# Patient Record
Sex: Male | Born: 1970
Health system: Southern US, Community
[De-identification: ages and names within clinical notes are randomized; demographics above are authoritative.]

## PROBLEM LIST (undated history)

## (undated) DIAGNOSIS — R253 Fasciculation: Secondary | ICD-10-CM

## (undated) DIAGNOSIS — F32A Depression, unspecified: Secondary | ICD-10-CM

## (undated) DIAGNOSIS — I10 Essential (primary) hypertension: Secondary | ICD-10-CM

## (undated) DIAGNOSIS — F419 Anxiety disorder, unspecified: Secondary | ICD-10-CM

## (undated) DIAGNOSIS — E785 Hyperlipidemia, unspecified: Secondary | ICD-10-CM

## (undated) DIAGNOSIS — R52 Pain, unspecified: Secondary | ICD-10-CM

## (undated) DIAGNOSIS — E039 Hypothyroidism, unspecified: Secondary | ICD-10-CM

## (undated) DIAGNOSIS — F329 Major depressive disorder, single episode, unspecified: Secondary | ICD-10-CM

## (undated) DIAGNOSIS — G4730 Sleep apnea, unspecified: Secondary | ICD-10-CM

## (undated) HISTORY — PX: GASTRIC BYPASS: SHX52

## (undated) HISTORY — PX: HERNIA REPAIR: SHX51

## (undated) HISTORY — DX: Fasciculation: R25.3

## (undated) HISTORY — PX: BACK SURGERY: SHX140

## (undated) HISTORY — DX: Hyperlipidemia, unspecified: E78.5

---

## 2008-04-11 ENCOUNTER — Observation Stay (HOSPITAL_COMMUNITY): Admission: EM | Admit: 2008-04-11 | Discharge: 2008-04-12 | Payer: Self-pay | Admitting: Emergency Medicine

## 2008-04-11 ENCOUNTER — Ambulatory Visit: Payer: Self-pay | Admitting: Cardiovascular Disease

## 2009-08-08 ENCOUNTER — Ambulatory Visit (HOSPITAL_COMMUNITY): Admission: RE | Admit: 2009-08-08 | Discharge: 2009-08-08 | Payer: Self-pay | Admitting: Surgery

## 2009-08-13 ENCOUNTER — Ambulatory Visit (HOSPITAL_COMMUNITY): Admission: RE | Admit: 2009-08-13 | Discharge: 2009-08-13 | Payer: Self-pay | Admitting: Surgery

## 2009-09-18 ENCOUNTER — Encounter: Admission: RE | Admit: 2009-09-18 | Discharge: 2009-12-17 | Payer: Self-pay | Admitting: Surgery

## 2009-11-05 ENCOUNTER — Inpatient Hospital Stay (HOSPITAL_COMMUNITY): Admission: RE | Admit: 2009-11-05 | Discharge: 2009-11-07 | Payer: Self-pay | Admitting: Surgery

## 2009-11-06 ENCOUNTER — Ambulatory Visit: Payer: Self-pay | Admitting: Surgery

## 2009-11-06 ENCOUNTER — Encounter (INDEPENDENT_AMBULATORY_CARE_PROVIDER_SITE_OTHER): Payer: Self-pay | Admitting: Surgery

## 2009-11-12 ENCOUNTER — Emergency Department (HOSPITAL_COMMUNITY): Admission: EM | Admit: 2009-11-12 | Discharge: 2009-11-12 | Payer: Self-pay | Admitting: Emergency Medicine

## 2010-01-08 ENCOUNTER — Encounter: Admission: RE | Admit: 2010-01-08 | Discharge: 2010-03-15 | Payer: Self-pay | Admitting: Surgery

## 2010-06-26 ENCOUNTER — Encounter: Admit: 2010-06-26 | Payer: Self-pay | Admitting: Surgery

## 2010-08-07 ENCOUNTER — Other Ambulatory Visit (HOSPITAL_COMMUNITY): Payer: Self-pay | Admitting: Internal Medicine

## 2010-08-12 ENCOUNTER — Ambulatory Visit (HOSPITAL_COMMUNITY)
Admission: RE | Admit: 2010-08-12 | Discharge: 2010-08-12 | Disposition: A | Discharge status: Home / Self Care | Payer: BC Managed Care – PPO | Source: Ambulatory Visit | Attending: Internal Medicine | Admitting: Internal Medicine

## 2010-08-12 DIAGNOSIS — R109 Unspecified abdominal pain: Secondary | ICD-10-CM | POA: Insufficient documentation

## 2010-08-14 ENCOUNTER — Other Ambulatory Visit (HOSPITAL_COMMUNITY): Payer: Self-pay | Admitting: Internal Medicine

## 2010-08-19 ENCOUNTER — Encounter (HOSPITAL_COMMUNITY): Payer: Self-pay

## 2010-08-19 ENCOUNTER — Encounter (HOSPITAL_COMMUNITY)
Admission: RE | Admit: 2010-08-19 | Discharge: 2010-08-19 | Disposition: A | Discharge status: Home / Self Care | Payer: BC Managed Care – PPO | Source: Ambulatory Visit | Attending: Internal Medicine | Admitting: Internal Medicine

## 2010-08-19 DIAGNOSIS — R109 Unspecified abdominal pain: Secondary | ICD-10-CM | POA: Insufficient documentation

## 2010-08-19 HISTORY — DX: Essential (primary) hypertension: I10

## 2010-08-19 MED ORDER — TECHNETIUM TC 99M MEBROFENIN IV KIT
5.0000 | PACK | Freq: Once | INTRAVENOUS | Status: AC | PRN
  Administered 2010-08-19: 5.4 via INTRAVENOUS

## 2010-09-02 LAB — DIFFERENTIAL
Basophils Absolute: 0 10*3/uL (ref 0.0–0.1)
Basophils Absolute: 0 10*3/uL (ref 0.0–0.1)
Basophils Absolute: 0.1 10*3/uL (ref 0.0–0.1)
Basophils Relative: 1 % (ref 0–1)
Basophils Relative: 1 % (ref 0–1)
Eosinophils Absolute: 0 10*3/uL (ref 0.0–0.7)
Eosinophils Absolute: 0.1 10*3/uL (ref 0.0–0.7)
Eosinophils Relative: 1 % (ref 0–5)
Eosinophils Relative: 2 % (ref 0–5)
Lymphocytes Relative: 22 % (ref 12–46)
Lymphocytes Relative: 30 % (ref 12–46)
Lymphs Abs: 0.9 10*3/uL (ref 0.7–4.0)
Lymphs Abs: 1.8 10*3/uL (ref 0.7–4.0)
Lymphs Abs: 2.6 10*3/uL (ref 0.7–4.0)
Monocytes Absolute: 0.5 10*3/uL (ref 0.1–1.0)
Monocytes Absolute: 0.6 10*3/uL (ref 0.1–1.0)
Monocytes Relative: 7 % (ref 3–12)
Monocytes Relative: 7 % (ref 3–12)
Monocytes Relative: 8 % (ref 3–12)
Monocytes Relative: 9 % (ref 3–12)
Neutro Abs: 4.6 10*3/uL (ref 1.7–7.7)
Neutro Abs: 5.5 10*3/uL (ref 1.7–7.7)
Neutro Abs: 7.8 10*3/uL — ABNORMAL HIGH (ref 1.7–7.7)
Neutrophils Relative %: 60 % (ref 43–77)
Neutrophils Relative %: 61 % (ref 43–77)
Neutrophils Relative %: 67 % (ref 43–77)
Neutrophils Relative %: 83 % — ABNORMAL HIGH (ref 43–77)

## 2010-09-02 LAB — COMPREHENSIVE METABOLIC PANEL
ALT: 78 U/L — ABNORMAL HIGH (ref 0–53)
BUN: 11 mg/dL (ref 6–23)
BUN: 13 mg/dL (ref 6–23)
CO2: 24 mEq/L (ref 19–32)
Chloride: 101 mEq/L (ref 96–112)
Chloride: 103 mEq/L (ref 96–112)
Creatinine, Ser: 0.88 mg/dL (ref 0.4–1.5)
GFR calc Af Amer: >60 mL/min (ref >60)
GFR calc non Af Amer: >60 mL/min (ref >60)
Glucose, Bld: 104 mg/dL — ABNORMAL HIGH (ref 70–99)
Potassium: 4 mEq/L (ref 3.5–5.1)
Sodium: 134 mEq/L — ABNORMAL LOW (ref 135–145)

## 2010-09-02 LAB — CBC
HCT: 38.9 % — ABNORMAL LOW (ref 39.0–52.0)
HCT: 42.9 % (ref 39.0–52.0)
Hemoglobin: 14.5 g/dL (ref 13.0–17.0)
MCHC: 33.6 g/dL (ref 30.0–36.0)
MCV: 89.1 fL (ref 78.0–100.0)
MCV: 89.6 fL (ref 78.0–100.0)
Platelets: 181 10*3/uL (ref 150–400)
RBC: 4.35 MIL/uL (ref 4.22–5.81)
RBC: 4.62 MIL/uL (ref 4.22–5.81)
RBC: 4.79 MIL/uL (ref 4.22–5.81)
RBC: 4.84 MIL/uL (ref 4.22–5.81)
RDW: 13.9 % (ref 11.5–15.5)

## 2010-09-02 LAB — HEMOGLOBIN AND HEMATOCRIT, BLOOD: HCT: 40.9 % (ref 39.0–52.0)

## 2010-10-29 NOTE — H&P (Signed)
NAME:  Victor Oconnor, Victor Oconnor NO.:  [id number]   MEDICAL RECORD NO.:  [id number]          PATIENT TYPE:  EMS   LOCATION:  MAJO                         FACILITY:  [facility]   PHYSICIAN:  Brayton El, MD    DATE OF BIRTH:  09-19-1970   DATE OF ADMISSION:  04/11/2008  DATE OF DISCHARGE:                              HISTORY & PHYSICAL   CHIEF COMPLAINT:  Chest tightness.   HISTORY OF PRESENT ILLNESS:  Victor Oconnor is a 40 year old white male past  medical history significant for hypertension, hyperlipidemia, history of  tobacco use, obesity, who is presenting with a 3-day history of  exertional chest tightness.  The patient states that while at work on  Sunday, he noticed that when he exerted himself he felt chest discomfort  in the lower chest, upper abdominal area associated with shortness of  breath.  He states this chest pain was quickly relieved by rest.  Over  the ensuing 2 days, the patient states that the symptoms repeated  themselves, and that whenever he would exert himself to a certain degree  he would feel moderate chest discomfort and his chest discomfort would  resolve upon rest.  Other than the chest discomfort, he has been in his  normal state of health.  He denies any PND, orthopnea, lower extremity  edema or syncope.  He does state that he has felt a strange bubbling  sensation occasionally in the back of his neck.  However, he is unable  to further describe this sensation.  The patient also denies any a past  history of chest discomfort.   PAST MEDICAL HISTORY:  As in HPI.  Also, the patient has hypothyroidism  and is on thyroid replacement for this.   FAMILY HISTORY:  Negative for premature coronary disease.   SOCIAL HISTORY:  The patient has a 20 pack year history of tobacco use,  however he quit smoking 2 years ago.  He does not drink significant  amounts of alcohol.   ALLERGIES:  PENICILLIN.   MEDICATIONS:  1. Levothyroxine 175 mcg daily.  2. He  is on a water pill for hypertension.  3. Lipitor unknown dose.   REVIEW OF SYSTEMS:  As in HPI.  All other systems were reviewed and are  negative.   PHYSICAL EXAMINATION:  VITAL SIGNS:  temperature 97.5, pulse 65,  respirations 20, blood pressure 122/70.  GENERAL:  He is in no acute distress.  HEENT:  Nonfocal.  Normocephalic, atraumatic.  Neck is supple without  JVD or carotid bruit.  There is no lymphadenopathy HEART:  Regular rate  and rhythm without murmur or gallops.  LUNGS:  Clear to auscultation bilaterally.  ABDOMEN:  Soft, nontender, nondistended.  EXTREMITIES:  Without edema.  Skin is warm and dry.  NEUROLOGICAL:  Exam is nonfocal.  PSYCHIATRIC:  Patient is appropriate with normal levels of insight.   LABORATORY DATA:  Sodium 138, potassium 4.5, chloride 102, CO2 29, BUN  16, creatinine 0.70, glucose 97.  White count 8, hemoglobin 16,  hematocrit 48, platelet count 245.  His troponin is less than 0.05.  CK-  MB is 2.1.  His myoglobin 53.3.  LFTs are within normal limits.  His  calcium is 10.2.  In the ED, he had a CT scan the chest to rule out PE  which was indeed negative for pulmonary embolism.  He had abdominal  plain films which were within normal limits.   EKG independently interpreted by myself is normal sinus rhythm without  any ST or T-wave abnormalities.   ASSESSMENT:  70. A 40 year old white male with multiple risk factors for coronary      disease presenting with symptoms most consistent with unstable      angina.  2. Hypothyroidism.  3. Hypertension.  4. Hyperlipidemia.  5. Obesity.   PLAN:  The patient will be admitted to telemetry and ruled out for acute  myocardial infarction.  He will be started on aspirin, IV heparin,  Lopressor and a statin.  We will continue his levothyroxine at his home  dose of 175 mcg daily and check a TSH in the a.m.  We will also place  him on hydrochlorothiazide 12.5 mg daily for hypertension.  He has been  on diuretic at  home for hypertension.  He will be made n.p.o. after  midnight for a stress test versus a left heart catheterization tomorrow.      Brayton El, MD  Electronically Signed     SGA/MEDQ  D:  04/11/2008  T:  04/12/2008  Job:  [id number]

## 2010-10-31 ENCOUNTER — Emergency Department (HOSPITAL_COMMUNITY)
Admission: EM | Admit: 2010-10-31 | Discharge: 2010-10-31 | Disposition: A | Discharge status: Home / Self Care | Payer: BC Managed Care – PPO | Attending: Emergency Medicine | Admitting: Emergency Medicine

## 2010-10-31 ENCOUNTER — Emergency Department (HOSPITAL_COMMUNITY): Payer: BC Managed Care – PPO

## 2010-10-31 DIAGNOSIS — I10 Essential (primary) hypertension: Secondary | ICD-10-CM | POA: Insufficient documentation

## 2010-10-31 DIAGNOSIS — E039 Hypothyroidism, unspecified: Secondary | ICD-10-CM | POA: Insufficient documentation

## 2010-10-31 DIAGNOSIS — M79609 Pain in unspecified limb: Secondary | ICD-10-CM | POA: Insufficient documentation

## 2010-10-31 DIAGNOSIS — Z79899 Other long term (current) drug therapy: Secondary | ICD-10-CM | POA: Insufficient documentation

## 2010-10-31 DIAGNOSIS — M7989 Other specified soft tissue disorders: Secondary | ICD-10-CM | POA: Insufficient documentation

## 2010-10-31 DIAGNOSIS — E7800 Pure hypercholesterolemia, unspecified: Secondary | ICD-10-CM | POA: Insufficient documentation

## 2010-11-01 NOTE — Discharge Summary (Signed)
Victor Oconnor, Victor Oconnor NO.:  [id number]   MEDICAL RECORD NO.:  [id number]          PATIENT TYPE:  OBV   LOCATION:  4733                         FACILITY:  [facility]   PHYSICIAN:  Corky Crafts, MDDATE OF BIRTH:  August 31, 1970   DATE OF ADMISSION:  04/11/2008  DATE OF DISCHARGE:  04/12/2008                               DISCHARGE SUMMARY   DISCHARGE DIAGNOSES:  1. Chest pain, resolved.  2. Hypertension.  3. Dyslipidemia.  4. Hypothyroidism.   HOSPITAL COURSE:  [name] is a 40 year old male patient who was  admitted on April 11, 2008, with substernal chest pain.  He has a  history of hypertension and hyperlipidemia and he was admitted with  exertional chest tightness associated with shortness of breath.  He  remained in the hospital overnight.  His cardiac enzymes were negative.  Total cholesterol 174, triglycerides 125, HDL 46, LDL 103.  Sodium 140,  potassium 3.4, BUN 12, creatinine 0.72.  Hemoglobin 16.3, hematocrit 48.  It was felt safe that the patient could go home.   We elected to perform an exercise Cardiolite in the office on the same  day of discharge, April 12, 2008.  He is to leave the hospital and go  directly to the office for the stress test.  He is to continue taking  his Lipitor daily, his hydrochlorothiazide daily, his aspirin 325 mg a  day, levothyroxine 175 mcg a day, and his gout pill.  Utilize  sublingual nitroglycerin p.r.n. chest pain.      [name], P.A.      Corky Crafts, MD  Electronically Signed    LB/MEDQ  D:  05/30/2008  T:  05/31/2008  Job:  [id number]   cc:   Corky Crafts, MD

## 2010-12-02 ENCOUNTER — Other Ambulatory Visit (INDEPENDENT_AMBULATORY_CARE_PROVIDER_SITE_OTHER): Payer: Self-pay | Admitting: Surgery

## 2010-12-03 LAB — COMPREHENSIVE METABOLIC PANEL
ALT: 14 U/L (ref 0–53)
Albumin: 4.7 g/dL (ref 3.5–5.2)
Alkaline Phosphatase: 57 U/L (ref 39–117)
CO2: 29 mEq/L (ref 19–32)
Chloride: 101 mEq/L (ref 96–112)
Sodium: 136 mEq/L (ref 135–145)
Total Bilirubin: 0.6 mg/dL (ref 0.3–1.2)
Total Protein: 7.8 g/dL (ref 6.0–8.3)

## 2010-12-03 LAB — CBC WITH DIFFERENTIAL/PLATELET
Basophils Absolute: 0 10*3/uL (ref 0.0–0.1)
Basophils Relative: 1 % (ref 0–1)
Eosinophils Absolute: 0.2 10*3/uL (ref 0.0–0.7)
HCT: 44.8 % (ref 39.0–52.0)
MCHC: 33.5 g/dL (ref 30.0–36.0)
MCV: 95.5 fL (ref 78.0–100.0)
Monocytes Relative: 8 % (ref 3–12)
Platelets: 207 10*3/uL (ref 150–400)
RBC: 4.69 MIL/uL (ref 4.22–5.81)
RDW: 13.5 % (ref 11.5–15.5)

## 2010-12-03 LAB — IRON AND TIBC
Iron: 109 ug/dL (ref 42–165)
TIBC: 416 ug/dL (ref 215–435)

## 2010-12-03 LAB — VITAMIN B12: Vitamin B-12: 1949 pg/mL — ABNORMAL HIGH (ref 211–911)

## 2010-12-03 LAB — FOLATE: Folate: >20 ng/mL

## 2010-12-06 ENCOUNTER — Encounter (INDEPENDENT_AMBULATORY_CARE_PROVIDER_SITE_OTHER): Payer: Self-pay | Admitting: Surgery

## 2011-01-22 ENCOUNTER — Ambulatory Visit (INDEPENDENT_AMBULATORY_CARE_PROVIDER_SITE_OTHER): Payer: BC Managed Care – PPO | Admitting: Surgery

## 2011-01-22 ENCOUNTER — Encounter (INDEPENDENT_AMBULATORY_CARE_PROVIDER_SITE_OTHER): Payer: Self-pay | Admitting: Surgery

## 2011-01-22 VITALS — BP 144/88 | HR 72 | Temp 97.5°F | Ht 73.0 in | Wt 276.0 lb

## 2011-01-22 DIAGNOSIS — R209 Unspecified disturbances of skin sensation: Secondary | ICD-10-CM

## 2011-01-22 DIAGNOSIS — R202 Paresthesia of skin: Secondary | ICD-10-CM

## 2011-01-22 NOTE — Progress Notes (Signed)
Mr. Prows comes in today and he is almost 15 months status post Roux-en-Y gastric bypass. He has lost 41% of his excess weight into his BMI is 36. He is having nerve twitches in his legs his arms at times. His been followed Audree Camel, physician assistant in [place] office. She referred him to see me. His other problems are really nonexistent. His main complaint is some occasional numbness and tingling in his fingers and this twitching.  Otherwise he looks good. I will order a calcium level, CMET, and thyroid functions. In the meantime he will start on some quinine water (tonic water ). I'll see him in about 3 weeks in followup and review his lab and see how he is getting along.

## 2011-01-22 NOTE — Patient Instructions (Signed)
Drink one bottle of tonic water (with quinine) nightly See bariatric vitamin recommendation sheets Have lab drawn See me back in the office in 3-4 weeks

## 2011-01-24 ENCOUNTER — Other Ambulatory Visit (INDEPENDENT_AMBULATORY_CARE_PROVIDER_SITE_OTHER): Payer: Self-pay | Admitting: Surgery

## 2011-01-24 LAB — COMPREHENSIVE METABOLIC PANEL
ALT: 18 U/L (ref 0–53)
CO2: 24 mEq/L (ref 19–32)
Calcium: 9.6 mg/dL (ref 8.4–10.5)
Chloride: 104 mEq/L (ref 96–112)
Glucose, Bld: 88 mg/dL (ref 70–99)
Sodium: 138 mEq/L (ref 135–145)
Total Bilirubin: 0.5 mg/dL (ref 0.3–1.2)
Total Protein: 7.6 g/dL (ref 6.0–8.3)

## 2011-01-24 LAB — CALCIUM, IONIZED: Calcium, Ion: 1.19 mmol/L (ref 1.12–1.32)

## 2011-01-24 LAB — T4: T4, Total: 10 ug/dL (ref 5.0–12.5)

## 2011-01-31 ENCOUNTER — Telehealth (INDEPENDENT_AMBULATORY_CARE_PROVIDER_SITE_OTHER): Payer: Self-pay | Admitting: Surgery

## 2011-02-26 ENCOUNTER — Ambulatory Visit (INDEPENDENT_AMBULATORY_CARE_PROVIDER_SITE_OTHER): Payer: BC Managed Care – PPO | Admitting: Surgery

## 2011-03-13 ENCOUNTER — Encounter (INDEPENDENT_AMBULATORY_CARE_PROVIDER_SITE_OTHER): Payer: Self-pay | Admitting: Surgery

## 2011-03-18 LAB — POCT I-STAT, CHEM 8
Calcium, Ion: 1.22
Chloride: 102
Glucose, Bld: 99
HCT: 48
TCO2: 30

## 2011-03-18 LAB — TSH: TSH: 7.382 — ABNORMAL HIGH

## 2011-03-18 LAB — DIFFERENTIAL
Basophils Absolute: 0
Basophils Relative: 0
Eosinophils Absolute: 0.2
Eosinophils Relative: 2
Monocytes Absolute: 0.5
Monocytes Relative: 6
Neutro Abs: 5.3

## 2011-03-18 LAB — LIPASE, BLOOD: Lipase: 28

## 2011-03-18 LAB — BASIC METABOLIC PANEL
CO2: 27
Chloride: 105
GFR calc Af Amer: >60
Potassium: 3.4 — ABNORMAL LOW
Sodium: 140

## 2011-03-18 LAB — LIPID PANEL
Cholesterol: 174
Total CHOL/HDL Ratio: 3.8

## 2011-03-18 LAB — CARDIAC PANEL(CRET KIN+CKTOT+MB+TROPI)
CK, MB: 1.9
Relative Index: 1.7
Total CK: 113
Total CK: 114
Troponin I: <0.01

## 2011-03-18 LAB — URINALYSIS, ROUTINE W REFLEX MICROSCOPIC
Bilirubin Urine: NEGATIVE
Hgb urine dipstick: NEGATIVE
Protein, ur: NEGATIVE
Urobilinogen, UA: 0.2

## 2011-03-18 LAB — CBC
HCT: 47.5
Hemoglobin: 15.8
MCHC: 33.3
MCV: 92.8
RBC: 5.12
RDW: 12.8

## 2011-03-18 LAB — COMPREHENSIVE METABOLIC PANEL
ALT: 30
AST: 37
CO2: 29
Chloride: 102
Creatinine, Ser: 0.69
GFR calc Af Amer: >60
GFR calc non Af Amer: >60
Glucose, Bld: 97
Sodium: 138
Total Bilirubin: 1.4 — ABNORMAL HIGH

## 2011-03-18 LAB — PROTIME-INR
INR: 1
Prothrombin Time: 13.8

## 2011-12-11 ENCOUNTER — Ambulatory Visit: Payer: Self-pay

## 2011-12-11 ENCOUNTER — Other Ambulatory Visit: Payer: Self-pay | Admitting: Occupational Medicine

## 2011-12-11 DIAGNOSIS — M25519 Pain in unspecified shoulder: Secondary | ICD-10-CM

## 2012-01-05 ENCOUNTER — Encounter (HOSPITAL_COMMUNITY): Payer: Self-pay | Admitting: Pharmacy Technician

## 2012-01-07 ENCOUNTER — Ambulatory Visit (HOSPITAL_COMMUNITY)
Admission: RE | Admit: 2012-01-07 | Discharge: 2012-01-07 | Disposition: A | Discharge status: Home / Self Care | Payer: BC Managed Care – PPO | Source: Ambulatory Visit | Attending: Orthopedic Surgery | Admitting: Orthopedic Surgery

## 2012-01-07 ENCOUNTER — Encounter (HOSPITAL_COMMUNITY): Payer: Self-pay

## 2012-01-07 ENCOUNTER — Encounter (HOSPITAL_COMMUNITY)
Admission: RE | Admit: 2012-01-07 | Discharge: 2012-01-07 | Disposition: A | Discharge status: Home / Self Care | Payer: BC Managed Care – PPO | Source: Ambulatory Visit | Attending: Orthopedic Surgery | Admitting: Orthopedic Surgery

## 2012-01-07 DIAGNOSIS — S43429A Sprain of unspecified rotator cuff capsule, initial encounter: Secondary | ICD-10-CM | POA: Insufficient documentation

## 2012-01-07 DIAGNOSIS — X58XXXA Exposure to other specified factors, initial encounter: Secondary | ICD-10-CM | POA: Insufficient documentation

## 2012-01-07 DIAGNOSIS — Z01812 Encounter for preprocedural laboratory examination: Secondary | ICD-10-CM | POA: Insufficient documentation

## 2012-01-07 DIAGNOSIS — Z01810 Encounter for preprocedural cardiovascular examination: Secondary | ICD-10-CM | POA: Insufficient documentation

## 2012-01-07 HISTORY — DX: Sleep apnea, unspecified: G47.30

## 2012-01-07 HISTORY — DX: Anxiety disorder, unspecified: F41.9

## 2012-01-07 HISTORY — DX: Major depressive disorder, single episode, unspecified: F32.9

## 2012-01-07 HISTORY — DX: Depression, unspecified: F32.A

## 2012-01-07 HISTORY — DX: Hypothyroidism, unspecified: E03.9

## 2012-01-07 LAB — PROTIME-INR
INR: 0.98 (ref 0.00–1.49)
Prothrombin Time: 13.2 seconds (ref 11.6–15.2)

## 2012-01-07 LAB — URINALYSIS, ROUTINE W REFLEX MICROSCOPIC
Bilirubin Urine: NEGATIVE
Glucose, UA: NEGATIVE mg/dL
Hgb urine dipstick: NEGATIVE
Ketones, ur: NEGATIVE mg/dL
Leukocytes, UA: NEGATIVE
Nitrite: NEGATIVE
Protein, ur: NEGATIVE mg/dL
Specific Gravity, Urine: 1.016 (ref 1.005–1.030)
Urobilinogen, UA: 0.2 mg/dL (ref 0.0–1.0)
pH: 5.5 (ref 5.0–8.0)

## 2012-01-07 LAB — COMPREHENSIVE METABOLIC PANEL
ALT: 19 U/L (ref 0–53)
AST: 23 U/L (ref 0–37)
Albumin: 4.1 g/dL (ref 3.5–5.2)
Alkaline Phosphatase: 49 U/L (ref 39–117)
BUN: 12 mg/dL (ref 6–23)
CO2: 27 mEq/L (ref 19–32)
Calcium: 9.1 mg/dL (ref 8.4–10.5)
Chloride: 99 mEq/L (ref 96–112)
Creatinine, Ser: 0.76 mg/dL (ref 0.50–1.35)
GFR calc Af Amer: >90 mL/min (ref >90)
GFR calc non Af Amer: >90 mL/min (ref >90)
Glucose, Bld: 90 mg/dL (ref 70–99)
Potassium: 4.1 mEq/L (ref 3.5–5.1)
Sodium: 135 mEq/L (ref 135–145)
Total Bilirubin: 0.5 mg/dL (ref 0.3–1.2)
Total Protein: 7.6 g/dL (ref 6.0–8.3)

## 2012-01-07 LAB — DIFFERENTIAL
Basophils Absolute: 0.1 10*3/uL (ref 0.0–0.1)
Basophils Relative: 1 % (ref 0–1)
Eosinophils Absolute: 0.2 10*3/uL (ref 0.0–0.7)
Eosinophils Relative: 3 % (ref 0–5)
Lymphocytes Relative: 34 % (ref 12–46)
Lymphs Abs: 1.9 10*3/uL (ref 0.7–4.0)
Monocytes Absolute: 0.4 10*3/uL (ref 0.1–1.0)
Monocytes Relative: 7 % (ref 3–12)
Neutro Abs: 3.1 10*3/uL (ref 1.7–7.7)
Neutrophils Relative %: 56 % (ref 43–77)

## 2012-01-07 LAB — CBC
HCT: 43.4 % (ref 39.0–52.0)
Hemoglobin: 15 g/dL (ref 13.0–17.0)
MCH: 30.9 pg (ref 26.0–34.0)
MCHC: 34.6 g/dL (ref 30.0–36.0)
MCV: 89.3 fL (ref 78.0–100.0)
Platelets: 227 10*3/uL (ref 150–400)
RBC: 4.86 MIL/uL (ref 4.22–5.81)
RDW: 12.2 % (ref 11.5–15.5)
WBC: 5.6 10*3/uL (ref 4.0–10.5)

## 2012-01-07 LAB — APTT: aPTT: 28 seconds (ref 24–37)

## 2012-01-07 LAB — SURGICAL PCR SCREEN
MRSA, PCR: NEGATIVE
Staphylococcus aureus: NEGATIVE

## 2012-01-07 NOTE — Patient Instructions (Signed)
20 Keagan Brislin  01/07/2012   Your procedure is scheduled on:  01/14/12    Wednesday   Surgery 0730-0900  Report to [facility] at   0515    AM.  Call this number if you have problems the morning of surgery: [phone]     Or PST   [id number]  [facility]   Remember:   Do not eat food  Or drink any fluids :After Midnight.   Tuesday NIGHT        Take these medicines the morning of surgery with A SIP OF WATER: Citalopram, Levothyroxine  Xanax if needed  Do not wear jewelry, make-up or nail polish.  Do not wear lotions, powders, or perfumes. You may wear deodorant.  Do not shave 48 hours prior to surgery.  Do not bring valuables to the hospital.  Contacts, dentures or bridgework may not be worn into surgery.  Leave suitcase in the car. After surgery it may be brought to your room.  For patients admitted to the hospital, checkout time is 11:00 AM the day of discharge.   Patients discharged the day of surgery will not be allowed to drive home.  Name and phone number of your driver:                                                                      Special Instructions: CHG Shower Use Special Wash: 1/2 bottle night before surgery and 1/2 bottle morning of surgery. REGULAR SOAP FACE AND PRIVATES                           MEN-MAY SHAVE FACE MORNING OF SURGERY  Please read over the following fact sheets that you were given: MRSA Information

## 2012-01-07 NOTE — H&P (Signed)
  Victor Oconnor DOB: [birth year]  Chief complaint: left shoulder pain  History of Present Illness The patient is a 41 year old male who is scheduled for a left rotator cuff repair by Dr. Darrelyn Hillock on Wednesday January 14, 2012.  The injury resulted from throwing and lifting that occurred at work. The patient reports symptoms which include shoulder pain, decreased range of motion, night pain, inability to lay on that side and neck pain. Symptoms are exacerbated by motion at the shoulder, elevation of the shoulder, lifting and throwing. MRI revealed left rotator cuff tear with retraction.     Past Medical History Anxiety Disorder Depression Gout Hypertension Hypothyroidism   Allergies Penicillins   Family History Diabetes Mellitus. father Hypertension. mother and father   Social History Alcohol use. current drinker; drinks beer; less than 5 per week Children. 1 Current work status. working full time Drug/Alcohol Rehab (Currently). no Drug/Alcohol Rehab (Previously). no Exercise. Exercises monthly; does running / walking Illicit drug use. no Living situation. live with partner Marital status. single Number of flights of stairs before winded. greater than 5 Pain Contract. no Tobacco / smoke exposure. no Tobacco use. former smoker; smoke(d) 1 pack(s) per day   Medication History Levothyroxine Sodium ( Tablet, Oral) Active. ALPRAZolam (1MG  Tablet, Oral) Active. Citalopram 20mg  Active Vitamin B-12 1000 mcg Active Calcium 500 mg Active  Review of Systems General:Not Present- Chills, Fever, Night Sweats, Appetite Loss, Fatigue, Feeling sick, Weight Gain and Weight Loss. Skin:Not Present- Itching, Rash, Skin Color Changes, Ulcer, Psoriasis and Change in Hair or Nails. HEENT:Not Present- Sensitivity to light, Hearing problems, Nose Bleed and Ringing in the Ears. Neck:Not Present- Swollen Glands and Neck Mass. Respiratory:Not Present-  Snoring, Chronic Cough, Bloody sputum and Dyspnea. Cardiovascular:Not Present- Shortness of Breath, Chest Pain, Swelling of Extremities, Leg Cramps and Palpitations. Gastrointestinal:Not Present- Bloody Stool, Heartburn, Abdominal Pain, Vomiting, Nausea and Incontinence of Stool. Male Genitourinary:Not Present- Blood in Urine, Frequency, Incontinence and Nocturia. Musculoskeletal:Present- Muscle Pain. Not Present- Muscle Weakness, Joint Stiffness, Joint Swelling, Joint Pain and Back Pain. Neurological:Present- Numbness. Not Present- Tingling, Burning, Tremor, Headaches and Dizziness. Psychiatric:Present- Anxiety and Depression. Not Present- Memory Loss. Endocrine:Not Present- Cold Intolerance, Heat Intolerance, Excessive hunger and Excessive Thirst. Hematology:Not Present- Abnormal Bleeding, Anemia, Blood Clots and Easy Bruising.   Vitals Weight: 276 lb Height: 71 in Body Surface Area: 2.5 m Body Mass Index: 38.49 kg/m Pulse: 88 (Regular) BP: 140/100 (Sitting, Left Arm, Standard)    Physical Exam The physical exam shows his inability to completely abduct the left shoulder. He is extremely weak. He has some trouble with shoulder flexion as well. The biceps and triceps are intact. Elbow is normal. Forearm is normal. Wrist is normal. He has a good radial pulse, good function in his hand. Lungs are clear to auscultation. Normal sinus rhythm, no murmurs. Neck supple. Abdomen soft and nontender. The oral cavity is negative.    RADIOGRAPHS: Plain x-rays of the shoulder are negative. MRI reveals a complete rotator cuff tear with retraction.    Assessment & Plan Complete rotator cuff rupture, non-traumatic (727.61) Open left shoulder rotator cuff repair with graft and anchors     [employer]. PA-C

## 2012-01-14 ENCOUNTER — Encounter (HOSPITAL_COMMUNITY)
Admission: RE | Disposition: A | Discharge status: Home / Self Care | Payer: Self-pay | Source: Ambulatory Visit | Attending: Orthopedic Surgery

## 2012-01-14 ENCOUNTER — Encounter (HOSPITAL_COMMUNITY): Payer: Self-pay

## 2012-01-14 ENCOUNTER — Ambulatory Visit (HOSPITAL_COMMUNITY)
Admission: RE | Admit: 2012-01-14 | Discharge: 2012-01-15 | Disposition: A | Discharge status: Home / Self Care | Payer: BC Managed Care – PPO | Source: Ambulatory Visit | Attending: Orthopedic Surgery | Admitting: Orthopedic Surgery

## 2012-01-14 ENCOUNTER — Encounter (HOSPITAL_COMMUNITY): Payer: Self-pay | Admitting: Anesthesiology

## 2012-01-14 ENCOUNTER — Ambulatory Visit (HOSPITAL_COMMUNITY): Payer: BC Managed Care – PPO | Admitting: Anesthesiology

## 2012-01-14 DIAGNOSIS — Z9889 Other specified postprocedural states: Secondary | ICD-10-CM

## 2012-01-14 DIAGNOSIS — F329 Major depressive disorder, single episode, unspecified: Secondary | ICD-10-CM | POA: Insufficient documentation

## 2012-01-14 DIAGNOSIS — Z79899 Other long term (current) drug therapy: Secondary | ICD-10-CM | POA: Insufficient documentation

## 2012-01-14 DIAGNOSIS — S43429A Sprain of unspecified rotator cuff capsule, initial encounter: Secondary | ICD-10-CM | POA: Insufficient documentation

## 2012-01-14 DIAGNOSIS — R259 Unspecified abnormal involuntary movements: Secondary | ICD-10-CM | POA: Insufficient documentation

## 2012-01-14 DIAGNOSIS — G4730 Sleep apnea, unspecified: Secondary | ICD-10-CM | POA: Insufficient documentation

## 2012-01-14 DIAGNOSIS — X500XXA Overexertion from strenuous movement or load, initial encounter: Secondary | ICD-10-CM | POA: Insufficient documentation

## 2012-01-14 DIAGNOSIS — F3289 Other specified depressive episodes: Secondary | ICD-10-CM | POA: Insufficient documentation

## 2012-01-14 DIAGNOSIS — M75122 Complete rotator cuff tear or rupture of left shoulder, not specified as traumatic: Secondary | ICD-10-CM

## 2012-01-14 DIAGNOSIS — E039 Hypothyroidism, unspecified: Secondary | ICD-10-CM | POA: Insufficient documentation

## 2012-01-14 DIAGNOSIS — I10 Essential (primary) hypertension: Secondary | ICD-10-CM | POA: Insufficient documentation

## 2012-01-14 DIAGNOSIS — Y990 Civilian activity done for income or pay: Secondary | ICD-10-CM | POA: Insufficient documentation

## 2012-01-14 DIAGNOSIS — F411 Generalized anxiety disorder: Secondary | ICD-10-CM | POA: Insufficient documentation

## 2012-01-14 HISTORY — PX: SHOULDER OPEN ROTATOR CUFF REPAIR: SHX2407

## 2012-01-14 SURGERY — REPAIR, ROTATOR CUFF, OPEN
Anesthesia: General | Site: Shoulder | Laterality: Left | Wound class: Clean

## 2012-01-14 MED ORDER — CITALOPRAM HYDROBROMIDE 20 MG PO TABS
20.0000 mg | ORAL_TABLET | Freq: Every morning | ORAL | Status: DC
Start: 2012-01-15 — End: 2012-01-15
  Administered 2012-01-15: 20 mg via ORAL
  Filled 2012-01-14: qty 1

## 2012-01-14 MED ORDER — POLYETHYLENE GLYCOL 3350 17 G PO PACK
17.0000 g | PACK | Freq: Every day | ORAL | Status: DC | PRN
  Filled 2012-01-14: qty 1

## 2012-01-14 MED ORDER — PROPOFOL 10 MG/ML IV EMUL
INTRAVENOUS | Status: DC | PRN
  Administered 2012-01-14: 150 mg via INTRAVENOUS

## 2012-01-14 MED ORDER — ACETAMINOPHEN 650 MG RE SUPP: 650.0000 mg | Freq: Four times a day (QID) | RECTAL | Status: DC | PRN

## 2012-01-14 MED ORDER — ACETAMINOPHEN 10 MG/ML IV SOLN
INTRAVENOUS | Status: AC
  Filled 2012-01-14: qty 100

## 2012-01-14 MED ORDER — HYDROMORPHONE HCL PF 1 MG/ML IJ SOLN
0.2500 mg | INTRAMUSCULAR | Status: DC | PRN
  Administered 2012-01-14 (×4): 0.5 mg via INTRAVENOUS

## 2012-01-14 MED ORDER — ALPRAZOLAM 1 MG PO TABS
1.0000 mg | ORAL_TABLET | Freq: Every evening | ORAL | Status: DC | PRN
  Administered 2012-01-14: 1 mg via ORAL
  Filled 2012-01-14: qty 1

## 2012-01-14 MED ORDER — FLEET ENEMA 7-19 GM/118ML RE ENEM: 1.0000 | ENEMA | Freq: Once | RECTAL | Status: AC | PRN

## 2012-01-14 MED ORDER — HYDROCODONE-ACETAMINOPHEN 5-325 MG PO TABS
1.0000 | ORAL_TABLET | ORAL | Status: DC | PRN
  Administered 2012-01-15 (×2): 2 via ORAL
  Filled 2012-01-14 (×2): qty 2

## 2012-01-14 MED ORDER — CLINDAMYCIN PHOSPHATE 900 MG/50ML IV SOLN
900.0000 mg | INTRAVENOUS | Status: AC
  Administered 2012-01-14: 900 mg via INTRAVENOUS

## 2012-01-14 MED ORDER — BUPIVACAINE-EPINEPHRINE (PF) 0.5% -1:200000 IJ SOLN
INTRAMUSCULAR | Status: AC
  Filled 2012-01-14: qty 10

## 2012-01-14 MED ORDER — ALPRAZOLAM 1 MG PO TABS
1.0000 mg | ORAL_TABLET | Freq: Three times a day (TID) | ORAL | Status: DC | PRN
  Administered 2012-01-14: 1 mg via ORAL
  Filled 2012-01-14: qty 1

## 2012-01-14 MED ORDER — PHENOL 1.4 % MT LIQD
1.0000 | OROMUCOSAL | Status: DC | PRN
  Filled 2012-01-14: qty 177

## 2012-01-14 MED ORDER — SODIUM CHLORIDE 0.9 % IR SOLN
Status: DC | PRN
  Administered 2012-01-14: 08:00:00

## 2012-01-14 MED ORDER — ONDANSETRON HCL 4 MG PO TABS: 4.0000 mg | ORAL_TABLET | Freq: Four times a day (QID) | ORAL | Status: DC | PRN

## 2012-01-14 MED ORDER — BUPIVACAINE LIPOSOME 1.3 % IJ SUSP
20.0000 mL | Freq: Once | INTRAMUSCULAR | Status: DC
  Filled 2012-01-14: qty 20

## 2012-01-14 MED ORDER — ACETAMINOPHEN 325 MG PO TABS: 650.0000 mg | ORAL_TABLET | Freq: Four times a day (QID) | ORAL | Status: DC | PRN

## 2012-01-14 MED ORDER — BUPIVACAINE LIPOSOME 1.3 % IJ SUSP
INTRAMUSCULAR | Status: DC | PRN
  Administered 2012-01-14: 20 mL

## 2012-01-14 MED ORDER — CLINDAMYCIN PHOSPHATE 900 MG/50ML IV SOLN
INTRAVENOUS | Status: AC
  Filled 2012-01-14: qty 50

## 2012-01-14 MED ORDER — LACTATED RINGERS IV SOLN: INTRAVENOUS | Status: DC

## 2012-01-14 MED ORDER — CLINDAMYCIN PHOSPHATE 900 MG/50ML IV SOLN
900.0000 mg | Freq: Four times a day (QID) | INTRAVENOUS | Status: AC
  Administered 2012-01-14 – 2012-01-15 (×3): 900 mg via INTRAVENOUS
  Filled 2012-01-14 (×3): qty 50

## 2012-01-14 MED ORDER — SODIUM CHLORIDE 0.9 % IJ SOLN
INTRAMUSCULAR | Status: DC | PRN
  Administered 2012-01-14: 50 mL

## 2012-01-14 MED ORDER — HYDROMORPHONE HCL PF 1 MG/ML IJ SOLN
0.5000 mg | INTRAMUSCULAR | Status: DC | PRN
  Administered 2012-01-14 (×2): 1 mg via INTRAVENOUS
  Filled 2012-01-14 (×2): qty 1

## 2012-01-14 MED ORDER — THROMBIN 5000 UNITS EX SOLR
CUTANEOUS | Status: AC
  Filled 2012-01-14: qty 10000

## 2012-01-14 MED ORDER — HYDROMORPHONE HCL PF 1 MG/ML IJ SOLN
INTRAMUSCULAR | Status: AC
  Filled 2012-01-14: qty 1

## 2012-01-14 MED ORDER — BISACODYL 10 MG RE SUPP: 10.0000 mg | Freq: Every day | RECTAL | Status: DC | PRN

## 2012-01-14 MED ORDER — ONDANSETRON HCL 4 MG/2ML IJ SOLN: 4.0000 mg | Freq: Four times a day (QID) | INTRAMUSCULAR | Status: DC | PRN

## 2012-01-14 MED ORDER — BACITRACIN-NEOMYCIN-POLYMYXIN 400-5-5000 EX OINT
TOPICAL_OINTMENT | CUTANEOUS | Status: AC
  Filled 2012-01-14: qty 1

## 2012-01-14 MED ORDER — LEVOTHYROXINE SODIUM 200 MCG PO TABS
200.0000 ug | ORAL_TABLET | Freq: Every day | ORAL | Status: DC
  Administered 2012-01-15: 200 ug via ORAL
  Filled 2012-01-14 (×2): qty 1

## 2012-01-14 MED ORDER — METHOCARBAMOL 500 MG PO TABS
500.0000 mg | ORAL_TABLET | Freq: Four times a day (QID) | ORAL | Status: DC | PRN
  Administered 2012-01-14: 500 mg via ORAL
  Filled 2012-01-14: qty 1

## 2012-01-14 MED ORDER — METHOCARBAMOL 100 MG/ML IJ SOLN
500.0000 mg | Freq: Four times a day (QID) | INTRAVENOUS | Status: DC | PRN
  Filled 2012-01-14: qty 5

## 2012-01-14 MED ORDER — MIDAZOLAM HCL 5 MG/5ML IJ SOLN
INTRAMUSCULAR | Status: DC | PRN
  Administered 2012-01-14: 2 mg via INTRAVENOUS

## 2012-01-14 MED ORDER — FENTANYL CITRATE 0.05 MG/ML IJ SOLN
INTRAMUSCULAR | Status: DC | PRN
  Administered 2012-01-14: 50 ug via INTRAVENOUS
  Administered 2012-01-14: 100 ug via INTRAVENOUS
  Administered 2012-01-14 (×4): 25 ug via INTRAVENOUS

## 2012-01-14 MED ORDER — THROMBIN 5000 UNITS EX SOLR
CUTANEOUS | Status: DC | PRN
  Administered 2012-01-14: 5000 [IU] via TOPICAL

## 2012-01-14 MED ORDER — LACTATED RINGERS IV SOLN
INTRAVENOUS | Status: DC
  Administered 2012-01-14: 1000 mL via INTRAVENOUS

## 2012-01-14 MED ORDER — VITAMIN B-12 1000 MCG PO TABS
2000.0000 ug | ORAL_TABLET | ORAL | Status: DC
  Administered 2012-01-15: 2000 ug via ORAL
  Filled 2012-01-14: qty 2

## 2012-01-14 MED ORDER — LIDOCAINE HCL (CARDIAC) 20 MG/ML IV SOLN
INTRAVENOUS | Status: DC | PRN
  Administered 2012-01-14: 100 mg via INTRAVENOUS

## 2012-01-14 MED ORDER — ONDANSETRON HCL 4 MG/2ML IJ SOLN
INTRAMUSCULAR | Status: DC | PRN
  Administered 2012-01-14: 4 mg via INTRAVENOUS

## 2012-01-14 MED ORDER — DEXAMETHASONE SODIUM PHOSPHATE 10 MG/ML IJ SOLN
INTRAMUSCULAR | Status: DC | PRN
  Administered 2012-01-14: 10 mg via INTRAVENOUS

## 2012-01-14 MED ORDER — PROMETHAZINE HCL 25 MG/ML IJ SOLN: 6.2500 mg | INTRAMUSCULAR | Status: DC | PRN

## 2012-01-14 MED ORDER — ACETAMINOPHEN 10 MG/ML IV SOLN
INTRAVENOUS | Status: DC | PRN
  Administered 2012-01-14: 1000 mg via INTRAVENOUS

## 2012-01-14 MED ORDER — SUCCINYLCHOLINE CHLORIDE 20 MG/ML IJ SOLN
INTRAMUSCULAR | Status: DC | PRN
  Administered 2012-01-14: 100 mg via INTRAVENOUS

## 2012-01-14 MED ORDER — MENTHOL 3 MG MT LOZG
1.0000 | LOZENGE | OROMUCOSAL | Status: DC | PRN
  Filled 2012-01-14: qty 9

## 2012-01-14 MED ORDER — OXYCODONE-ACETAMINOPHEN 5-325 MG PO TABS: 1.0000 | ORAL_TABLET | ORAL | Status: DC | PRN

## 2012-01-14 MED ORDER — LACTATED RINGERS IV SOLN
INTRAVENOUS | Status: DC | PRN
  Administered 2012-01-14 (×2): via INTRAVENOUS

## 2012-01-14 MED ORDER — HYDROMORPHONE HCL PF 2 MG/ML IJ SOLN
2.0000 mg | INTRAMUSCULAR | Status: DC | PRN
  Administered 2012-01-14 – 2012-01-15 (×4): 2 mg via INTRAVENOUS
  Filled 2012-01-14 (×4): qty 1

## 2012-01-14 SURGICAL SUPPLY — 51 items
ANCHOR PEEK ZIP 5.5 NDL NO2 (Orthopedic Implant) ×6 IMPLANT
BAG ZIPLOCK 12X15 (MISCELLANEOUS) ×2 IMPLANT
BLADE OSCILLATING/SAGITTAL (BLADE) ×1
BLADE SW THK.38XMED LNG THN (BLADE) ×1 IMPLANT
BNDG COHESIVE 6X5 TAN NS LF (GAUZE/BANDAGES/DRESSINGS) IMPLANT
BUR OVAL CARBIDE 4.0 (BURR) ×2 IMPLANT
CLEANER TIP ELECTROSURG 2X2 (MISCELLANEOUS) ×2 IMPLANT
CLOTH BEACON ORANGE TIMEOUT ST (SAFETY) ×2 IMPLANT
CLSR STERI-STRIP ANTIMIC 1/2X4 (GAUZE/BANDAGES/DRESSINGS) ×2 IMPLANT
DRAPE POUCH INSTRU U-SHP 10X18 (DRAPES) ×2 IMPLANT
DRSG ADAPTIC 3X8 NADH LF (GAUZE/BANDAGES/DRESSINGS) ×2 IMPLANT
DRSG EMULSION OIL 3X3 NADH (GAUZE/BANDAGES/DRESSINGS) IMPLANT
DRSG PAD ABDOMINAL 8X10 ST (GAUZE/BANDAGES/DRESSINGS) ×6 IMPLANT
DURAPREP 26ML APPLICATOR (WOUND CARE) ×2 IMPLANT
ELECT REM PT RETURN 9FT ADLT (ELECTROSURGICAL) ×2
ELECTRODE REM PT RTRN 9FT ADLT (ELECTROSURGICAL) ×1 IMPLANT
FLOSEAL 10ML (HEMOSTASIS) IMPLANT
GLOVE BIOGEL PI IND STRL 8 (GLOVE) ×2 IMPLANT
GLOVE BIOGEL PI IND STRL 8.5 (GLOVE) ×1 IMPLANT
GLOVE BIOGEL PI INDICATOR 8 (GLOVE) ×2
GLOVE BIOGEL PI INDICATOR 8.5 (GLOVE) ×1
GLOVE ECLIPSE 8.0 STRL XLNG CF (GLOVE) ×4 IMPLANT
GLOVE SURG SS PI 6.5 STRL IVOR (GLOVE) ×4 IMPLANT
GOWN PREVENTION PLUS LG XLONG (DISPOSABLE) ×4 IMPLANT
GOWN STRL REIN XL XLG (GOWN DISPOSABLE) ×4 IMPLANT
KIT BASIN OR (CUSTOM PROCEDURE TRAY) ×2 IMPLANT
MANIFOLD NEPTUNE II (INSTRUMENTS) ×2 IMPLANT
NEEDLE MA TROC 1/2 (NEEDLE) IMPLANT
NS IRRIG 1000ML POUR BTL (IV SOLUTION) ×2 IMPLANT
PACK SHOULDER CUSTOM OPM052 (CUSTOM PROCEDURE TRAY) ×2 IMPLANT
PASSER SUT SWANSON 36MM LOOP (INSTRUMENTS) IMPLANT
PATCH TISSUE MEND 3X3CM (Orthopedic Implant) ×2 IMPLANT
POSITIONER SURGICAL ARM (MISCELLANEOUS) ×2 IMPLANT
SLING ARM IMMOBILIZER LRG (SOFTGOODS) IMPLANT
SLING ARM IMMOBILIZER XL (CAST SUPPLIES) ×2 IMPLANT
SPONGE GAUZE 4X4 12PLY (GAUZE/BANDAGES/DRESSINGS) ×2 IMPLANT
SPONGE SURGIFOAM ABS GEL 100 (HEMOSTASIS) ×2 IMPLANT
STAPLER VISISTAT 35W (STAPLE) IMPLANT
STRIP CLOSURE SKIN 1/2X4 (GAUZE/BANDAGES/DRESSINGS) ×2 IMPLANT
SUCTION FRAZIER 12FR DISP (SUCTIONS) ×2 IMPLANT
SUT BONE WAX W31G (SUTURE) ×2 IMPLANT
SUT ETHIBOND NAB CT1 #1 30IN (SUTURE) ×4 IMPLANT
SUT MNCRL AB 4-0 PS2 18 (SUTURE) ×2 IMPLANT
SUT VIC AB 0 CT1 27 (SUTURE) ×1
SUT VIC AB 0 CT1 27XBRD ANTBC (SUTURE) ×1 IMPLANT
SUT VIC AB 1 CT1 27 (SUTURE) ×2
SUT VIC AB 1 CT1 27XBRD ANTBC (SUTURE) ×2 IMPLANT
SUT VIC AB 2-0 CT1 27 (SUTURE) ×2
SUT VIC AB 2-0 CT1 27XBRD (SUTURE) ×2 IMPLANT
TAPE CLOTH SURG 6X10 WHT LF (GAUZE/BANDAGES/DRESSINGS) ×2 IMPLANT
TOWEL OR 17X26 10 PK STRL BLUE (TOWEL DISPOSABLE) ×4 IMPLANT

## 2012-01-14 NOTE — Interval H&P Note (Signed)
History and Physical Interval Note:  01/14/2012 7:06 AM  Victor Oconnor  has presented today for surgery, with the diagnosis of left shoulder rotator cuff tear  The various methods of treatment have been discussed with the patient and family. After consideration of risks, benefits and other options for treatment, the patient has consented to  Procedure(s) (LRB): ROTATOR CUFF REPAIR SHOULDER OPEN (Left) as a surgical intervention .  The patient's history has been reviewed, patient examined, no change in status, stable for surgery.  I have reviewed the patient's chart and labs.  Questions were answered to the patient's satisfaction.     [name] A

## 2012-01-14 NOTE — Preoperative (Signed)
Beta Blockers   Reason not to administer Beta Blockers:Not Applicable 

## 2012-01-14 NOTE — Anesthesia Postprocedure Evaluation (Signed)
Anesthesia Post Note  Patient: Victor Oconnor  Procedure(s) Performed: Procedure(s) (LRB): ROTATOR CUFF REPAIR SHOULDER OPEN (Left)  Anesthesia type: General  Patient location: PACU  Post pain: Pain level controlled  Post assessment: Post-op Vital signs reviewed  Last Vitals:  Filed Vitals:   01/14/12 0945  BP: 158/86  Pulse: 80  Temp:   Resp: 15    Post vital signs: Reviewed  Level of consciousness: sedated  Complications: No apparent anesthesia complications

## 2012-01-14 NOTE — Transfer of Care (Signed)
Immediate Anesthesia Transfer of Care Note  Patient: Victor Oconnor  Procedure(s) Performed: Procedure(s) (LRB): ROTATOR CUFF REPAIR SHOULDER OPEN (Left)  Patient Location: PACU  Anesthesia Type: General  Level of Consciousness: awake, alert  and oriented  Airway & Oxygen Therapy: Patient Spontanous Breathing and Patient connected to face mask oxygen  Post-op Assessment: Report given to PACU RN and Post -op Vital signs reviewed and stable  Post vital signs: Reviewed and stable  Complications: No apparent anesthesia complications

## 2012-01-14 NOTE — Brief Op Note (Signed)
01/14/2012  9:26 AM  PATIENT:  Victor Oconnor  41 y.o. male  PRE-OPERATIVE DIAGNOSIS:  left shoulder rotator cuff tear Complex with Retraction POST-OPERATIVE DIAGNOSIS:  Left shouder rotator cuff tear,Complex Tear,complete with Retraction.  PROCEDURE:  Procedure(s) (LRB): ROTATOR CUFF REPAIR SHOULDER OPEN (Left),with Acromionectomy and Tissue Mend Graft and three Stryker Anchors.  SURGEON:  Surgeon(s) and Role:    * Jacki Cones, MD - Primary  PHYSICIAN ASSISTANT: Avel Peace PA }   ANESTHESIA:   general  EBL:  Total I/O In: 1200 [I.V.:1200] Out: -   BLOOD ADMINISTERED:none  DRAINS: none   LOCAL MEDICATIONS USED:  BUPIVICAINE 20cc mixed with 10cc of Normal Saline.  SPECIMEN:  No Specimen  DISPOSITION OF SPECIMEN:  N/A  COUNTS:  YES  TOURNIQUET:  * No tourniquets in log *  DICTATION: .Other Dictation: Dictation Number [id number]  PLAN OF CARE: Admit to inpatient   PATIENT DISPOSITION:  PACU - hemodynamically stable.   Delay start of Pharmacological VTE agent (>24hrs) due to surgical blood loss or risk of bleeding: yes

## 2012-01-14 NOTE — Progress Notes (Signed)
Pt's neuro checks are good, Lt hand is warm, radial pulse is strong and he is able to move all fingers on his lt. Hand. Tolerating PO fluids well.C/O of pain in his Lt. Shoulder #8 Dilaudid 1mg  not helping. MD called. Ice has been on LT. shoulder continuously. Pt. Does not want to get up out of bed right now due to the pain. Dressing to Lt. Shoulder remains dry and intact.

## 2012-01-14 NOTE — Anesthesia Preprocedure Evaluation (Signed)
Anesthesia Evaluation  Patient identified by MRN, date of birth, ID band Patient awake    Reviewed: Allergy & Precautions, H&P , NPO status , Patient's Chart, lab work & pertinent test results  Airway Mallampati: II      Dental  (+) Teeth Intact and Dental Advisory Given   Pulmonary sleep apnea , former smoker,  breath sounds clear to auscultation  Pulmonary exam normal       Cardiovascular hypertension, Rhythm:Regular Rate:Normal     Neuro/Psych Anxiety Depression negative neurological ROS     GI/Hepatic Neg liver ROS, Prior gastric by-pass   Endo/Other  negative endocrine ROSHypothyroidism Morbid obesity  Renal/GU negative Renal ROS  negative genitourinary   Musculoskeletal negative musculoskeletal ROS (+)   Abdominal   Peds  Hematology negative hematology ROS (+)   Anesthesia Other Findings   Reproductive/Obstetrics negative OB ROS                           Anesthesia Physical Anesthesia Plan  ASA: III  Anesthesia Plan: General   Post-op Pain Management:    Induction: Intravenous  Airway Management Planned: Oral ETT  Additional Equipment:   Intra-op Plan:   Post-operative Plan: Extubation in OR  Informed Consent: I have reviewed the patients History and Physical, chart, labs and discussed the procedure including the risks, benefits and alternatives for the proposed anesthesia with the patient or authorized representative who has indicated his/her understanding and acceptance.   Dental advisory given  Plan Discussed with: CRNA  Anesthesia Plan Comments:         Anesthesia Quick Evaluation

## 2012-01-15 ENCOUNTER — Encounter (HOSPITAL_COMMUNITY): Payer: Self-pay | Admitting: Orthopedic Surgery

## 2012-01-15 DIAGNOSIS — Z9889 Other specified postprocedural states: Secondary | ICD-10-CM

## 2012-01-15 DIAGNOSIS — M75122 Complete rotator cuff tear or rupture of left shoulder, not specified as traumatic: Secondary | ICD-10-CM | POA: Diagnosis present

## 2012-01-15 MED ORDER — OXYCODONE-ACETAMINOPHEN 5-325 MG PO TABS: 1.0000 | ORAL_TABLET | ORAL | Status: AC | PRN

## 2012-01-15 MED ORDER — METHOCARBAMOL 500 MG PO TABS: 500.0000 mg | ORAL_TABLET | Freq: Four times a day (QID) | ORAL | Status: AC | PRN

## 2012-01-15 NOTE — Progress Notes (Signed)
OT Note Eval completed and filed in shadow chart. All education completed. Pt will have necessary level of A from family upon d/c. Progress rehab of the shoulder as ordered by MD post f/u visit. Pt presents with no further acute OT needs. Will sign off.   Garrel Ridgel, OTR/L  Pager [phone] 01/15/2012

## 2012-01-15 NOTE — Progress Notes (Signed)
   Subjective: 1 Day Post-Op Procedure(s) (LRB): ROTATOR CUFF REPAIR SHOULDER OPEN (Left) Patient reports pain as moderate.   Patient seen in rounds with Dr.Gioffre. Patient is having some pain in the left shoulder. He reports that he did not sleep well last night due to pain. He denies shortness of breath and chest pain. No complaints of numbness or tingling in the arm or hand. Voiding well on his own. He reports that he is ready to go home.   Objective: Vital signs in last 24 hours: Temp:  [97.5 F (36.4 C)-98.4 F (36.9 C)] 97.5 F (36.4 C) (08/01 0605) Pulse Rate:  [66-82] 73  (08/01 0605) Resp:  [13-20] 18  (08/01 0605) BP: (125-162)/(77-94) 134/80 mmHg (08/01 0605) SpO2:  [95 %-100 %] 97 % (08/01 0605) Weight:  [130 kg (286 lb 9.6 oz)] 130 kg (286 lb 9.6 oz) (07/31 1025)  Intake/Output from previous day:  Intake/Output Summary (Last 24 hours) at 01/15/12 0733 Last data filed at 01/15/12 0700  Gross per 24 hour  Intake 4013.33 ml  Output   4630 ml  Net -616.67 ml      EXAM General - Patient is Alert and Oriented Extremity - Neurologically intact Sensation intact distally Intact pulses distally Dressing/Incision - clean, dry, no drainage Motor Function - intact, moving hand and fingers well on exam  Past Medical History  Diagnosis Date  . Hypertension   . Muscle twitch   . Hypothyroidism   . Depression   . Anxiety   . Sleep apnea     prior to gastric bypass 2011- no cpap at present    Assessment/Plan: 1 Day Post-Op Procedure(s) (LRB): ROTATOR CUFF REPAIR SHOULDER OPEN (Left) Active Problems:  Complete rotator cuff rupture of left shoulder  S/P rotator cuff repair   Advance diet D/C IV fluids Discharge home  Wear sling on left shoulder at all times Follow up in office in 2 weeks DC on Percocet, Robaxin  Melisia Leming LAUREN 01/15/2012, 7:33 AM

## 2012-01-15 NOTE — Op Note (Signed)
NAMEORIAN, FIGUEIRA NO.:  [id number]  MEDICAL RECORD NO.:  [id number]  LOCATION:  1315                         FACILITY:  [facility]  PHYSICIAN:  Georges Lynch. Stan Cantave, M.D.DATE OF BIRTH:  04/08/1971  DATE OF PROCEDURE: DATE OF DISCHARGE:                              OPERATIVE REPORT   SURGEON:  Beva Remund A. Alexiz Sustaita, M.D.  ASSISTANT:  Alexzandrew L. Perkins, P.A.C.  PREOPERATIVE DIAGNOSIS: 1. __________ retracted tear of the left rotator cuff. 2. Morbid obesity.  POSTOPERATIVE DIAGNOSIS: 1. __________ retracted tear of the left rotator cuff. 2. Morbid obesity.  OPERATION: 1. Open acromionectomy, left shoulder. 2. Repair of a complex tear of the rotator cuff tendon, which was     retracted, left shoulder. 3. TissueMend graft, left shoulder. 4. Three anchors were used, left shoulder.  PROCEDURE:  Under general anesthesia, routine orthopedic prep and drape of the left upper extremity was carried out.  The patient was in the beach chair position.  I did the appropriate time-out, first also marked the appropriate left arm in the holding area.  The patient had Cleocin 900 mg IV preop.  At that particular time, incision was made over the anterior aspect left shoulder.  Bleeders identified and cauterized.  I then went down and stripped the deltoid tendon from the acromion in the usual fashion.  He had severe impingement.  I protected the underlying rotator cuff with a Bennett retractor and did an open acromionectomy followed by acromioplasty and thoroughly irrigated out the area and bone waxed the undersurface of the acromion.  Of note, the acromion was severely thickened and downsloping.  Following that, I then went on and burred the lateral articular surface of the __________ humerus, with great care taken not to injure the overlying biceps tendon.  Rotator cuff was completely torn from the humeral head.  It was retracted that was irregular on the end. I had to bring  in __________ 2 clamps and then __________ side-to-side suture first, after that, I inserted 3 Stryker anchors in the proximal humerus.  I then brought the tendon forward and sutured it into place, with the arm adducted.  At this particular time, I then utilized a TissueMend graft to reinforce it and cover the bare area as well.  The wound was thoroughly irrigated.  Note this was a __________ procedure, he was markedly overweight and the tendon with a severe complete tear with retraction.  Once we finished the repair, I then reapproximated deltoid tendon muscle in the usual fashion.  All bleeders were cauterized.  The skin was closed with a subcuticular suture. Before closing the skin, injected 20 mL of Exparel mixed with 10 mL of normal saline.  He was then placed in a shoulder immobilizer.          ______________________________ Georges Lynch Darrelyn Hillock, M.D.     RAG/MEDQ  D:  01/14/2012  T:  01/15/2012  Job:  [id number]

## 2012-01-20 NOTE — Discharge Summary (Signed)
Physician Discharge Summary   Patient ID: Victor Oconnor MRN: [id number] DOB/AGE: February 08, 1971 41 y.o.  Admit date: 01/14/2012 Discharge date: 01/15/2012  Primary Diagnosis:  Complete rotator cuff rupture, left shoulder  Admission Diagnoses:  Past Medical History  Diagnosis Date  . Hypertension   . Muscle twitch   . Hypothyroidism   . Depression   . Anxiety   . Sleep apnea     prior to gastric bypass 2011- no cpap at present   Discharge Diagnoses:   Active Problems:  Complete rotator cuff rupture of left shoulder  S/P rotator cuff repair  Procedure:  Procedure(s) (LRB): ROTATOR CUFF REPAIR SHOULDER OPEN (Left)   Consults: None  HPI: The patient is a 40 year old male who has been followed for his left shoulder pain. The shoulder pain resulted from an injury.  The injury resulted from throwing and lifting that occurred at work. The patient reported symptoms which include shoulder pain, decreased range of motion, night pain, inability to lay on that side and neck pain. Symptoms exacerbated by motion at the shoulder, elevation of the shoulder, lifting and throwing. MRI revealed left rotator cuff tear with retraction.      Laboratory Data: Hospital Outpatient Visit on 01/07/2012  Component Date Value Range Status  . aPTT 01/07/2012 28  24 - 37 seconds Final  . WBC 01/07/2012 5.6  4.0 - 10.5 K/uL Final  . RBC 01/07/2012 4.86  4.22 - 5.81 MIL/uL Final  . Hemoglobin 01/07/2012 15.0  13.0 - 17.0 g/dL Final  . HCT 40/98/1191 43.4  39.0 - 52.0 % Final  . MCV 01/07/2012 89.3  78.0 - 100.0 fL Final  . MCH 01/07/2012 30.9  26.0 - 34.0 pg Final  . MCHC 01/07/2012 34.6  30.0 - 36.0 g/dL Final  . RDW 47/82/9562 12.2  11.5 - 15.5 % Final  . Platelets 01/07/2012 227  150 - 400 K/uL Final  . Sodium 01/07/2012 135  135 - 145 mEq/L Final  . Potassium 01/07/2012 4.1  3.5 - 5.1 mEq/L Final  . Chloride 01/07/2012 99  96 - 112 mEq/L Final  . CO2 01/07/2012 27  19 - 32 mEq/L Final  . Glucose,  Bld 01/07/2012 90  70 - 99 mg/dL Final  . BUN 13/01/6577 12  6 - 23 mg/dL Final  . Creatinine, Ser 01/07/2012 0.76  0.50 - 1.35 mg/dL Final  . Calcium 46/96/2952 9.1  8.4 - 10.5 mg/dL Final  . Total Protein 01/07/2012 7.6  6.0 - 8.3 g/dL Final  . Albumin 84/13/2440 4.1  3.5 - 5.2 g/dL Final  . AST 04/12/2535 23  0 - 37 U/L Final  . ALT 01/07/2012 19  0 - 53 U/L Final  . Alkaline Phosphatase 01/07/2012 49  39 - 117 U/L Final  . Total Bilirubin 01/07/2012 0.5  0.3 - 1.2 mg/dL Final  . GFR calc non Af Amer 01/07/2012 >90  >90 mL/min Final  . GFR calc Af Amer 01/07/2012 >90  >90 mL/min Final   Comment:                                 The eGFR has been calculated                          using the CKD EPI equation.  This calculation has not been                          validated in all clinical                          situations.                          eGFR's persistently                          <90 mL/min signify                          possible Chronic Kidney Disease.  Marland Kitchen Neutrophils Relative 01/07/2012 56  43 - 77 % Final  . Neutro Abs 01/07/2012 3.1  1.7 - 7.7 K/uL Final  . Lymphocytes Relative 01/07/2012 34  12 - 46 % Final  . Lymphs Abs 01/07/2012 1.9  0.7 - 4.0 K/uL Final  . Monocytes Relative 01/07/2012 7  3 - 12 % Final  . Monocytes Absolute 01/07/2012 0.4  0.1 - 1.0 K/uL Final  . Eosinophils Relative 01/07/2012 3  0 - 5 % Final  . Eosinophils Absolute 01/07/2012 0.2  0.0 - 0.7 K/uL Final  . Basophils Relative 01/07/2012 1  0 - 1 % Final  . Basophils Absolute 01/07/2012 0.1  0.0 - 0.1 K/uL Final  . Prothrombin Time 01/07/2012 13.2  11.6 - 15.2 seconds Final  . INR 01/07/2012 0.98  0.00 - 1.49 Final  . Color, Urine 01/07/2012 YELLOW  YELLOW Final  . APPearance 01/07/2012 CLEAR  CLEAR Final  . Specific Gravity, Urine 01/07/2012 1.016  1.005 - 1.030 Final  . pH 01/07/2012 5.5  5.0 - 8.0 Final  . Glucose, UA 01/07/2012 NEGATIVE  NEGATIVE mg/dL Final    . Hgb urine dipstick 01/07/2012 NEGATIVE  NEGATIVE Final  . Bilirubin Urine 01/07/2012 NEGATIVE  NEGATIVE Final  . Ketones, ur 01/07/2012 NEGATIVE  NEGATIVE mg/dL Final  . Protein, ur 47/82/9562 NEGATIVE  NEGATIVE mg/dL Final  . Urobilinogen, UA 01/07/2012 0.2  0.0 - 1.0 mg/dL Final  . Nitrite 13/01/6577 NEGATIVE  NEGATIVE Final  . Leukocytes, UA 01/07/2012 NEGATIVE  NEGATIVE Final   MICROSCOPIC NOT DONE ON URINES WITH NEGATIVE PROTEIN, BLOOD, LEUKOCYTES, NITRITE, OR GLUCOSE <1000 mg/dL.  Marland Kitchen MRSA, PCR 01/07/2012 NEGATIVE  NEGATIVE Final  . Staphylococcus aureus 01/07/2012 NEGATIVE  NEGATIVE Final   Comment:                                 The Xpert SA Assay (FDA                          approved for NASAL specimens                          only), is one component of                          a comprehensive surveillance                          program.  It is not intended  to diagnose infection nor to                          guide or monitor treatment.    X-Rays:Dg Chest 2 View  01/07/2012  *RADIOLOGY REPORT*  Clinical Data: Rotator cuff repair  CHEST - 2 VIEW  Comparison: 08/08/09  Findings: The heart size and mediastinal contours are within normal limits.  Both lungs are clear.  The visualized skeletal structures are unremarkable.  IMPRESSION: Negative exam.  Original Report Authenticated By: Rosealee Albee, M.D.    EKG: Orders placed during the hospital encounter of 01/14/12  . EKG     Hospital Course: Patient was admitted to [facility] and taken to the OR and underwent the above state procedure without complications.  Patient tolerated the procedure well and was later transferred to the recovery room and then to the orthopaedic floor for postoperative care.  They were given PO and IV analgesics for pain control following their surgery.  They were given 24 hours of postoperative antibiotics. OT ordered for sling management instruction. Patient had  a decent night on the evening of surgery but did have some difficulty sleeping due to pain. Dressing was changed and the incision was clean and dry.   Patient was seen in rounds and was ready to go home after instruction from OT.   Discharge Medications: Prior to Admission medications   Medication Sig Start Date End Date Taking? Authorizing Provider  ALPRAZolam Prudy Feeler) 1 MG tablet Take 1 mg by mouth at bedtime as needed. For sleep   Yes Historical Provider, MD  Calcium Carbonate (CALCIUM 500 PO) Take 1,000 mg by mouth daily.   Yes Historical Provider, MD  citalopram (CELEXA) 20 MG tablet Take 20 mg by mouth every morning.   Yes Historical Provider, MD  levothyroxine (SYNTHROID, LEVOTHROID) 200 MCG tablet Take 200 mcg by mouth daily before breakfast.    Yes Historical Provider, MD  Multiple Vitamin (MULTIVITAMIN WITH MINERALS) TABS Take 2 tablets by mouth daily. chews   Yes Historical Provider, MD  vitamin B-12 (CYANOCOBALAMIN) 1000 MCG tablet Take 2,000 mcg by mouth every other day.   Yes Historical Provider, MD  methocarbamol (ROBAXIN) 500 MG tablet Take 1 tablet (500 mg total) by mouth every 6 (six) hours as needed. 01/15/12 01/25/12  Teegan Brandis Tamala Ser, PA  oxyCODONE-acetaminophen (PERCOCET/ROXICET) 5-325 MG per tablet Take 1-2 tablets by mouth every 4 (four) hours as needed. 01/15/12 01/25/12  Crystian Frith Tamala Ser, PA    Diet: Regular diet Activity:Wear sling on left shoulder at all times Follow-up:in 2 weeks Disposition - Home Discharged Condition: fair   Discharge Orders    Future Orders Please Complete By Expires   Diet - low sodium heart healthy      Call MD / Call 911      Comments:   If you experience chest pain or shortness of breath, CALL 911 and be transported to the hospital emergency room.  If you develope a fever above 101 F, pus (white drainage) or increased drainage or redness at the wound, or calf pain, call your surgeon's office.   Constipation Prevention       Comments:   Drink plenty of fluids.  Prune juice may be helpful.  You may use a stool softener, such as Colace (over the counter) 100 mg twice a day.  Use MiraLax (over the counter) for constipation as needed.   Increase activity slowly as tolerated  Discharge instructions      Comments:   Keep your sling on at all times, including sleeping in your sling. The only time you should remove your sling is to shower only but you need to keep your hand against your chest while you shower.  For the first few days, remove your dressing, tape a piece of saran wrap over your incision, take your shower, then remove the saran wrap and put a clean dressing on, then reapply your sling. After two days you can shower without the saran wrap.  Call Dr. Darrelyn Hillock if any wound complications or temperature of 101 degrees F or over.  Call the office for an appointment to see Dr. Darrelyn Hillock in two weeks: [phone] and ask for Dr. Jeannetta Ellis nurse, Mackey Birchwood.   Driving restrictions      Comments:   No driving for 2 weeks   Lifting restrictions      Comments:   No lifting     Medication List  As of 01/20/2012  3:29 PM   TAKE these medications         ALPRAZolam 1 MG tablet   Commonly known as: XANAX   Take 1 mg by mouth at bedtime as needed. For sleep      CALCIUM 500 PO   Take 1,000 mg by mouth daily.      citalopram 20 MG tablet   Commonly known as: CELEXA   Take 20 mg by mouth every morning.      levothyroxine 200 MCG tablet   Commonly known as: SYNTHROID, LEVOTHROID   Take 200 mcg by mouth daily before breakfast.      methocarbamol 500 MG tablet   Commonly known as: ROBAXIN   Take 1 tablet (500 mg total) by mouth every 6 (six) hours as needed.      multivitamin with minerals Tabs   Take 2 tablets by mouth daily. chews      oxyCODONE-acetaminophen 5-325 MG per tablet   Commonly known as: PERCOCET/ROXICET   Take 1-2 tablets by mouth every 4 (four) hours as needed.      vitamin B-12 1000  MCG tablet   Commonly known as: CYANOCOBALAMIN   Take 2,000 mcg by mouth every other day.             Signed: Micai Apolinar LAUREN 01/20/2012, 3:29 PM

## 2012-03-01 ENCOUNTER — Encounter (HOSPITAL_COMMUNITY): Payer: Self-pay | Admitting: *Deleted

## 2012-03-01 NOTE — Pre-Procedure Instructions (Signed)
PT IS ADD ON - FOR SURGERY TOMORROW 03/02/12--SAME DAY SURGERY INSTRUCTIONS REVIEWED WITH PT BY PHONE - INCLUDING BETASEPT OR HIBICLENS SHOWER TONIGHT AND IN AM--BUT DO NOT USE ON FACE OR HEAD OR PRIVATE AREAS-MAY USE NORMAL SOAP THOSE AREAS.

## 2012-03-02 ENCOUNTER — Encounter (HOSPITAL_COMMUNITY)
Admission: RE | Disposition: A | Discharge status: Home / Self Care | Payer: Self-pay | Source: Ambulatory Visit | Attending: Orthopedic Surgery

## 2012-03-02 ENCOUNTER — Encounter (HOSPITAL_COMMUNITY): Payer: Self-pay | Admitting: Anesthesiology

## 2012-03-02 ENCOUNTER — Encounter (HOSPITAL_COMMUNITY): Payer: Self-pay | Admitting: *Deleted

## 2012-03-02 ENCOUNTER — Inpatient Hospital Stay (HOSPITAL_COMMUNITY)
Admission: RE | Admit: 2012-03-02 | Discharge: 2012-03-05 | DRG: 223 | Disposition: A | Discharge status: Home / Self Care | Payer: BC Managed Care – PPO | Source: Ambulatory Visit | Attending: Orthopedic Surgery | Admitting: Orthopedic Surgery

## 2012-03-02 ENCOUNTER — Ambulatory Visit (HOSPITAL_COMMUNITY): Payer: BC Managed Care – PPO | Admitting: Anesthesiology

## 2012-03-02 DIAGNOSIS — F411 Generalized anxiety disorder: Secondary | ICD-10-CM | POA: Diagnosis present

## 2012-03-02 DIAGNOSIS — Z6837 Body mass index (BMI) 37.0-37.9, adult: Secondary | ICD-10-CM

## 2012-03-02 DIAGNOSIS — Z9889 Other specified postprocedural states: Secondary | ICD-10-CM

## 2012-03-02 DIAGNOSIS — M75122 Complete rotator cuff tear or rupture of left shoulder, not specified as traumatic: Secondary | ICD-10-CM | POA: Diagnosis present

## 2012-03-02 DIAGNOSIS — F329 Major depressive disorder, single episode, unspecified: Secondary | ICD-10-CM | POA: Diagnosis present

## 2012-03-02 DIAGNOSIS — E785 Hyperlipidemia, unspecified: Secondary | ICD-10-CM | POA: Diagnosis present

## 2012-03-02 DIAGNOSIS — I10 Essential (primary) hypertension: Secondary | ICD-10-CM | POA: Diagnosis present

## 2012-03-02 DIAGNOSIS — F3289 Other specified depressive episodes: Secondary | ICD-10-CM | POA: Diagnosis present

## 2012-03-02 DIAGNOSIS — Z87891 Personal history of nicotine dependence: Secondary | ICD-10-CM

## 2012-03-02 DIAGNOSIS — X58XXXA Exposure to other specified factors, initial encounter: Secondary | ICD-10-CM | POA: Diagnosis present

## 2012-03-02 DIAGNOSIS — E669 Obesity, unspecified: Secondary | ICD-10-CM | POA: Diagnosis present

## 2012-03-02 DIAGNOSIS — IMO0002 Reserved for concepts with insufficient information to code with codable children: Secondary | ICD-10-CM | POA: Diagnosis present

## 2012-03-02 DIAGNOSIS — F419 Anxiety disorder, unspecified: Secondary | ICD-10-CM | POA: Diagnosis present

## 2012-03-02 DIAGNOSIS — G4730 Sleep apnea, unspecified: Secondary | ICD-10-CM | POA: Diagnosis present

## 2012-03-02 DIAGNOSIS — S43429A Sprain of unspecified rotator cuff capsule, initial encounter: Principal | ICD-10-CM | POA: Diagnosis present

## 2012-03-02 DIAGNOSIS — E039 Hypothyroidism, unspecified: Secondary | ICD-10-CM | POA: Diagnosis present

## 2012-03-02 DIAGNOSIS — L03119 Cellulitis of unspecified part of limb: Secondary | ICD-10-CM | POA: Diagnosis present

## 2012-03-02 HISTORY — DX: Pain, unspecified: R52

## 2012-03-02 LAB — COMPREHENSIVE METABOLIC PANEL
ALT: 34 U/L (ref 0–53)
AST: 45 U/L — ABNORMAL HIGH (ref 0–37)
Albumin: 3.7 g/dL (ref 3.5–5.2)
Alkaline Phosphatase: 64 U/L (ref 39–117)
BUN: 8 mg/dL (ref 6–23)
CO2: 26 mEq/L (ref 19–32)
Calcium: 9.1 mg/dL (ref 8.4–10.5)
Chloride: 99 mEq/L (ref 96–112)
Creatinine, Ser: 0.79 mg/dL (ref 0.50–1.35)
GFR calc Af Amer: >90 mL/min (ref >90)
GFR calc non Af Amer: >90 mL/min (ref >90)
Glucose, Bld: 97 mg/dL (ref 70–99)
Potassium: 3.7 mEq/L (ref 3.5–5.1)
Sodium: 136 mEq/L (ref 135–145)
Total Bilirubin: 0.3 mg/dL (ref 0.3–1.2)
Total Protein: 7.5 g/dL (ref 6.0–8.3)

## 2012-03-02 LAB — CBC
HCT: 41 % (ref 39.0–52.0)
MCV: 89.3 fL (ref 78.0–100.0)
RBC: 4.59 MIL/uL (ref 4.22–5.81)
WBC: 5.4 10*3/uL (ref 4.0–10.5)

## 2012-03-02 LAB — PROTIME-INR
INR: 0.98 (ref 0.00–1.49)
Prothrombin Time: 12.9 seconds (ref 11.6–15.2)

## 2012-03-02 LAB — APTT: aPTT: 37 seconds (ref 24–37)

## 2012-03-02 SURGERY — IRRIGATION AND DEBRIDEMENT SHOULDER
Anesthesia: General | Site: Shoulder | Laterality: Left | Wound class: Clean Contaminated

## 2012-03-02 MED ORDER — HYDROCODONE-ACETAMINOPHEN 5-325 MG PO TABS: 1.0000 | ORAL_TABLET | ORAL | Status: DC | PRN

## 2012-03-02 MED ORDER — DEXAMETHASONE SODIUM PHOSPHATE 10 MG/ML IJ SOLN
INTRAMUSCULAR | Status: DC | PRN
  Administered 2012-03-02: 10 mg via INTRAVENOUS

## 2012-03-02 MED ORDER — VANCOMYCIN HCL IN DEXTROSE 1-5 GM/200ML-% IV SOLN
INTRAVENOUS | Status: AC
  Filled 2012-03-02: qty 200

## 2012-03-02 MED ORDER — HYDROMORPHONE HCL PF 1 MG/ML IJ SOLN: 0.5000 mg | INTRAMUSCULAR | Status: DC | PRN

## 2012-03-02 MED ORDER — KETOROLAC TROMETHAMINE 30 MG/ML IJ SOLN
INTRAMUSCULAR | Status: AC
  Filled 2012-03-02: qty 1

## 2012-03-02 MED ORDER — KETOROLAC TROMETHAMINE 30 MG/ML IJ SOLN
15.0000 mg | Freq: Once | INTRAMUSCULAR | Status: AC | PRN
  Administered 2012-03-02: 30 mg via INTRAVENOUS

## 2012-03-02 MED ORDER — OXYCODONE-ACETAMINOPHEN 5-325 MG PO TABS
1.0000 | ORAL_TABLET | ORAL | Status: DC | PRN
  Administered 2012-03-02 – 2012-03-05 (×10): 2 via ORAL
  Administered 2012-03-05: 1 via ORAL
  Filled 2012-03-02 (×10): qty 2

## 2012-03-02 MED ORDER — MENTHOL 3 MG MT LOZG
1.0000 | LOZENGE | OROMUCOSAL | Status: DC | PRN
  Filled 2012-03-02: qty 9

## 2012-03-02 MED ORDER — PHENOL 1.4 % MT LIQD: 1.0000 | OROMUCOSAL | Status: DC | PRN

## 2012-03-02 MED ORDER — ONDANSETRON HCL 4 MG/2ML IJ SOLN
INTRAMUSCULAR | Status: DC | PRN
  Administered 2012-03-02 (×2): 2 mg via INTRAVENOUS

## 2012-03-02 MED ORDER — ACETAMINOPHEN 10 MG/ML IV SOLN
INTRAVENOUS | Status: AC
  Filled 2012-03-02: qty 100

## 2012-03-02 MED ORDER — MUPIROCIN 2 % EX OINT
TOPICAL_OINTMENT | Freq: Two times a day (BID) | CUTANEOUS | Status: DC
  Administered 2012-03-02: 12:00:00 via NASAL
  Filled 2012-03-02: qty 22

## 2012-03-02 MED ORDER — ONDANSETRON HCL 4 MG PO TABS: 4.0000 mg | ORAL_TABLET | Freq: Four times a day (QID) | ORAL | Status: DC | PRN

## 2012-03-02 MED ORDER — ACETAMINOPHEN 10 MG/ML IV SOLN
INTRAVENOUS | Status: DC | PRN
  Administered 2012-03-02: 1000 mg via INTRAVENOUS

## 2012-03-02 MED ORDER — LIDOCAINE HCL (CARDIAC) 20 MG/ML IV SOLN
INTRAVENOUS | Status: DC | PRN
  Administered 2012-03-02: 100 mg via INTRAVENOUS

## 2012-03-02 MED ORDER — THROMBIN 5000 UNITS EX SOLR
CUTANEOUS | Status: AC
  Filled 2012-03-02: qty 5000

## 2012-03-02 MED ORDER — FENTANYL CITRATE 0.05 MG/ML IJ SOLN
INTRAMUSCULAR | Status: DC | PRN
  Administered 2012-03-02 (×3): 50 ug via INTRAVENOUS
  Administered 2012-03-02: 100 ug via INTRAVENOUS

## 2012-03-02 MED ORDER — VANCOMYCIN HCL IN DEXTROSE 1-5 GM/200ML-% IV SOLN
1000.0000 mg | Freq: Two times a day (BID) | INTRAVENOUS | Status: AC
  Administered 2012-03-03: 1000 mg via INTRAVENOUS
  Filled 2012-03-02: qty 200

## 2012-03-02 MED ORDER — HYDROMORPHONE HCL PF 1 MG/ML IJ SOLN
0.2500 mg | INTRAMUSCULAR | Status: DC | PRN
  Administered 2012-03-02 (×3): 0.5 mg via INTRAVENOUS

## 2012-03-02 MED ORDER — LEVOTHYROXINE SODIUM 200 MCG PO TABS
200.0000 ug | ORAL_TABLET | Freq: Every day | ORAL | Status: DC
  Administered 2012-03-03 – 2012-03-05 (×3): 200 ug via ORAL
  Filled 2012-03-02 (×5): qty 1

## 2012-03-02 MED ORDER — ALPRAZOLAM 1 MG PO TABS
1.0000 mg | ORAL_TABLET | Freq: Every day | ORAL | Status: DC
  Administered 2012-03-03 – 2012-03-04 (×3): 1 mg via ORAL
  Filled 2012-03-02 (×3): qty 1

## 2012-03-02 MED ORDER — ACETAMINOPHEN 325 MG PO TABS: 650.0000 mg | ORAL_TABLET | Freq: Four times a day (QID) | ORAL | Status: DC | PRN

## 2012-03-02 MED ORDER — CITALOPRAM HYDROBROMIDE 20 MG PO TABS
20.0000 mg | ORAL_TABLET | Freq: Every morning | ORAL | Status: DC
  Administered 2012-03-03 – 2012-03-05 (×3): 20 mg via ORAL
  Filled 2012-03-02 (×3): qty 1

## 2012-03-02 MED ORDER — MIDAZOLAM HCL 5 MG/5ML IJ SOLN
INTRAMUSCULAR | Status: DC | PRN
  Administered 2012-03-02 (×2): 1 mg via INTRAVENOUS

## 2012-03-02 MED ORDER — POLYETHYLENE GLYCOL 3350 17 G PO PACK: 17.0000 g | PACK | Freq: Every day | ORAL | Status: DC | PRN

## 2012-03-02 MED ORDER — VANCOMYCIN HCL 1000 MG IV SOLR
1000.0000 mg | INTRAVENOUS | Status: DC | PRN
  Administered 2012-03-02: 1000 mg via INTRAVENOUS

## 2012-03-02 MED ORDER — FLEET ENEMA 7-19 GM/118ML RE ENEM: 1.0000 | ENEMA | Freq: Once | RECTAL | Status: AC | PRN

## 2012-03-02 MED ORDER — SUCCINYLCHOLINE CHLORIDE 20 MG/ML IJ SOLN
INTRAMUSCULAR | Status: DC | PRN
  Administered 2012-03-02: 140 mg via INTRAVENOUS

## 2012-03-02 MED ORDER — SODIUM CHLORIDE 0.9 % IR SOLN
Status: DC | PRN
  Administered 2012-03-02: 1000 mL

## 2012-03-02 MED ORDER — HYDROMORPHONE HCL PF 1 MG/ML IJ SOLN
INTRAMUSCULAR | Status: AC
  Filled 2012-03-02: qty 1

## 2012-03-02 MED ORDER — LACTATED RINGERS IV SOLN: INTRAVENOUS | Status: DC

## 2012-03-02 MED ORDER — BACITRACIN ZINC 500 UNIT/GM EX OINT
TOPICAL_OINTMENT | CUTANEOUS | Status: AC
  Filled 2012-03-02: qty 15

## 2012-03-02 MED ORDER — ONDANSETRON HCL 4 MG/2ML IJ SOLN: 4.0000 mg | Freq: Four times a day (QID) | INTRAMUSCULAR | Status: DC | PRN

## 2012-03-02 MED ORDER — METHOCARBAMOL 500 MG PO TABS
500.0000 mg | ORAL_TABLET | Freq: Four times a day (QID) | ORAL | Status: DC | PRN
  Administered 2012-03-03 – 2012-03-04 (×4): 500 mg via ORAL
  Filled 2012-03-02 (×3): qty 1

## 2012-03-02 MED ORDER — SODIUM CHLORIDE 0.9 % IR SOLN
Status: DC | PRN
  Administered 2012-03-02: 14:00:00

## 2012-03-02 MED ORDER — METHOCARBAMOL 100 MG/ML IJ SOLN
500.0000 mg | Freq: Four times a day (QID) | INTRAVENOUS | Status: DC | PRN
  Administered 2012-03-02: 500 mg via INTRAVENOUS
  Filled 2012-03-02: qty 5

## 2012-03-02 MED ORDER — BUPIVACAINE LIPOSOME 1.3 % IJ SUSP
20.0000 mL | Freq: Once | INTRAMUSCULAR | Status: DC
  Filled 2012-03-02: qty 20

## 2012-03-02 MED ORDER — BISACODYL 10 MG RE SUPP: 10.0000 mg | Freq: Every day | RECTAL | Status: DC | PRN

## 2012-03-02 MED ORDER — PROMETHAZINE HCL 25 MG/ML IJ SOLN: 6.2500 mg | INTRAMUSCULAR | Status: DC | PRN

## 2012-03-02 MED ORDER — PROPOFOL 10 MG/ML IV EMUL
INTRAVENOUS | Status: DC | PRN
  Administered 2012-03-02: 200 mg via INTRAVENOUS

## 2012-03-02 MED ORDER — LACTATED RINGERS IV SOLN
INTRAVENOUS | Status: DC | PRN
  Administered 2012-03-02 (×2): via INTRAVENOUS

## 2012-03-02 MED ORDER — LACTATED RINGERS IV SOLN
INTRAVENOUS | Status: DC
  Administered 2012-03-02 – 2012-03-03 (×2): via INTRAVENOUS

## 2012-03-02 MED ORDER — ACETAMINOPHEN 650 MG RE SUPP: 650.0000 mg | Freq: Four times a day (QID) | RECTAL | Status: DC | PRN

## 2012-03-02 MED ORDER — CISATRACURIUM BESYLATE (PF) 10 MG/5ML IV SOLN
INTRAVENOUS | Status: DC | PRN
  Administered 2012-03-02: 6 mg via INTRAVENOUS

## 2012-03-02 MED ORDER — HYDROMORPHONE HCL PF 1 MG/ML IJ SOLN
INTRAMUSCULAR | Status: DC | PRN
  Administered 2012-03-02 (×2): 1 mg via INTRAVENOUS

## 2012-03-02 SURGICAL SUPPLY — 52 items
BAG ZIPLOCK 12X15 (MISCELLANEOUS) ×2 IMPLANT
BLADE OSCILLATING/SAGITTAL (BLADE) ×1
BLADE SW THK.38XMED LNG THN (BLADE) ×1 IMPLANT
BNDG COHESIVE 6X5 TAN NS LF (GAUZE/BANDAGES/DRESSINGS) IMPLANT
BUR OVAL CARBIDE 4.0 (BURR) ×2 IMPLANT
CLEANER TIP ELECTROSURG 2X2 (MISCELLANEOUS) ×2 IMPLANT
CLOTH BEACON ORANGE TIMEOUT ST (SAFETY) ×2 IMPLANT
CONT SPEC 4OZ CLIKSEAL STRL BL (MISCELLANEOUS) ×2 IMPLANT
DRAPE POUCH INSTRU U-SHP 10X18 (DRAPES) ×2 IMPLANT
DRSG EMULSION OIL 3X3 NADH (GAUZE/BANDAGES/DRESSINGS) ×2 IMPLANT
DRSG PAD ABDOMINAL 8X10 ST (GAUZE/BANDAGES/DRESSINGS) ×2 IMPLANT
DURAPREP 26ML APPLICATOR (WOUND CARE) ×2 IMPLANT
ELECT REM PT RETURN 9FT ADLT (ELECTROSURGICAL) ×2
ELECTRODE REM PT RTRN 9FT ADLT (ELECTROSURGICAL) ×1 IMPLANT
FLOSEAL 10ML (HEMOSTASIS) IMPLANT
GLOVE BIOGEL PI IND STRL 8 (GLOVE) ×1 IMPLANT
GLOVE BIOGEL PI IND STRL 8.5 (GLOVE) ×1 IMPLANT
GLOVE BIOGEL PI INDICATOR 8 (GLOVE) ×1
GLOVE BIOGEL PI INDICATOR 8.5 (GLOVE) ×1
GLOVE ECLIPSE 8.0 STRL XLNG CF (GLOVE) ×4 IMPLANT
GLOVE SURG SS PI 6.5 STRL IVOR (GLOVE) ×4 IMPLANT
GOWN PREVENTION PLUS LG XLONG (DISPOSABLE) ×4 IMPLANT
GOWN STRL REIN XL XLG (GOWN DISPOSABLE) ×4 IMPLANT
HANDPIECE INTERPULSE COAX TIP (DISPOSABLE) ×1
KIT BASIN OR (CUSTOM PROCEDURE TRAY) ×2 IMPLANT
MANIFOLD NEPTUNE II (INSTRUMENTS) ×2 IMPLANT
NEEDLE MA TROC 1/2 (NEEDLE) IMPLANT
NS IRRIG 1000ML POUR BTL (IV SOLUTION) IMPLANT
PACK SHOULDER CUSTOM OPM052 (CUSTOM PROCEDURE TRAY) ×2 IMPLANT
PASSER SUT SWANSON 36MM LOOP (INSTRUMENTS) IMPLANT
POSITIONER SURGICAL ARM (MISCELLANEOUS) ×2 IMPLANT
SET HNDPC FAN SPRY TIP SCT (DISPOSABLE) ×1 IMPLANT
SLING ARM IMMOBILIZER LRG (SOFTGOODS) ×2 IMPLANT
SPONGE GAUZE 4X4 12PLY (GAUZE/BANDAGES/DRESSINGS) ×2 IMPLANT
SPONGE LAP 18X18 X RAY DECT (DISPOSABLE) ×2 IMPLANT
SPONGE SURGIFOAM ABS GEL 100 (HEMOSTASIS) IMPLANT
STAPLER VISISTAT 35W (STAPLE) ×2 IMPLANT
STRIP CLOSURE SKIN 1/2X4 (GAUZE/BANDAGES/DRESSINGS) ×2 IMPLANT
SUCTION FRAZIER 12FR DISP (SUCTIONS) ×2 IMPLANT
SUT BONE WAX W31G (SUTURE) ×2 IMPLANT
SUT ETHIBOND NAB CT1 #1 30IN (SUTURE) IMPLANT
SUT MNCRL AB 4-0 PS2 18 (SUTURE) ×2 IMPLANT
SUT VIC AB 0 CT1 27 (SUTURE) ×1
SUT VIC AB 0 CT1 27XBRD ANTBC (SUTURE) ×1 IMPLANT
SUT VIC AB 1 CT1 27 (SUTURE) ×2
SUT VIC AB 1 CT1 27XBRD ANTBC (SUTURE) ×2 IMPLANT
SUT VIC AB 2-0 CT1 27 (SUTURE)
SUT VIC AB 2-0 CT1 27XBRD (SUTURE) IMPLANT
SWAB COLLECTION DEVICE MRSA (MISCELLANEOUS) ×4 IMPLANT
TAPE CLOTH SURG 4X10 WHT LF (GAUZE/BANDAGES/DRESSINGS) ×2 IMPLANT
TOWEL OR 17X26 10 PK STRL BLUE (TOWEL DISPOSABLE) ×4 IMPLANT
TUBE ANAEROBIC SPECIMEN COL (MISCELLANEOUS) ×4 IMPLANT

## 2012-03-02 NOTE — Anesthesia Postprocedure Evaluation (Signed)
  Anesthesia Post-op Note  Patient: Victor Oconnor  Procedure(s) Performed: Procedure(s) (LRB): IRRIGATION AND DEBRIDEMENT SHOULDER (Left)  Patient Location: PACU  Anesthesia Type: General  Level of Consciousness: awake and alert   Airway and Oxygen Therapy: Patient Spontanous Breathing  Post-op Pain: mild  Post-op Assessment: Post-op Vital signs reviewed, Patient's Cardiovascular Status Stable, Respiratory Function Stable, Patent Airway and No signs of Nausea or vomiting  Post-op Vital Signs: stable  Complications: No apparent anesthesia complications 

## 2012-03-02 NOTE — Anesthesia Procedure Notes (Signed)
Procedure Name: Intubation Date/Time: 03/02/2012 2:35 PM Performed by: Edison Pace Pre-anesthesia Checklist: Patient identified, Timeout performed, Emergency Drugs available, Suction available and Patient being monitored Patient Re-evaluated:Patient Re-evaluated prior to inductionOxygen Delivery Method: Circle system utilized Preoxygenation: Pre-oxygenation with 100% oxygen Intubation Type: IV induction Ventilation: Mask ventilation without difficulty Laryngoscope Size: Mac and 4 Grade View: Grade I Tube type: Oral Tube size: 7.5 mm Number of attempts: 1 Airway Equipment and Method: Stylet Secured at: 21 cm Tube secured with: Tape Dental Injury: Teeth and Oropharynx as per pre-operative assessment

## 2012-03-02 NOTE — Anesthesia Preprocedure Evaluation (Signed)
Anesthesia Evaluation  Patient identified by MRN, date of birth, ID band Patient awake    Reviewed: Allergy & Precautions, H&P , NPO status , Patient's Chart, lab work & pertinent test results  Airway Mallampati: II TM Distance: <3 FB Neck ROM: Full    Dental No notable dental hx.    Pulmonary neg pulmonary ROS,  breath sounds clear to auscultation  Pulmonary exam normal       Cardiovascular hypertension, Pt. on medications Rhythm:Regular Rate:Normal     Neuro/Psych negative neurological ROS  negative psych ROS   GI/Hepatic negative GI ROS, Neg liver ROS,   Endo/Other  Hypothyroidism Morbid obesity  Renal/GU negative Renal ROS  negative genitourinary   Musculoskeletal negative musculoskeletal ROS (+)   Abdominal   Peds negative pediatric ROS (+)  Hematology negative hematology ROS (+)   Anesthesia Other Findings   Reproductive/Obstetrics negative OB ROS                           Anesthesia Physical Anesthesia Plan  ASA: III  Anesthesia Plan: General   Post-op Pain Management:    Induction: Intravenous  Airway Management Planned: Oral ETT  Additional Equipment:   Intra-op Plan:   Post-operative Plan: Extubation in OR  Informed Consent: I have reviewed the patients History and Physical, chart, labs and discussed the procedure including the risks, benefits and alternatives for the proposed anesthesia with the patient or authorized representative who has indicated his/her understanding and acceptance.   Dental advisory given  Plan Discussed with: CRNA and Surgeon  Anesthesia Plan Comments:         Anesthesia Quick Evaluation

## 2012-03-02 NOTE — Brief Op Note (Signed)
03/02/2012  3:48 PM  PATIENT:  Victor Oconnor  41 y.o. male  PRE-OPERATIVE DIAGNOSIS:  status post rotator cuff repair 6 weeks ago rule out infection  POST-OPERATIVE DIAGNOSIS:  status post rotator cuff repair 6 weeks ago rule out infection  PROCEDURE:  Procedure(s) (LRB) with comments: ROTATOR CUFF REPAIR SHOULDER OPEN (Left) - Left Shoulder Exploration of Rotator Cuff with Cultures of Left Shoulder.  SURGEON:  Surgeon(s) and Role:    * Jacki Cones, MD - Primary     ASSISTANTS:OR Nurse.}   ANESTHESIA:   general  EBL:  Total I/O In: -  Out: 300 [Urine:300]  BLOOD ADMINISTERED:none  DRAINS: Penrose drain in the Left Shoulder   LOCAL MEDICATIONS USED:  NONE  SPECIMEN:  Source of Specimen:  Two seperat Cultures,#1- Was Deep in Joint.#2- was Subcutaneus.  DISPOSITION OF SPECIMEN:  PATHOLOGY  COUNTS:  YES  TOURNIQUET:  * No tourniquets in log *  DICTATION: .Other Dictation: Dictation Number [id number]  PLAN OF CARE: Admit to inpatient   PATIENT DISPOSITION:  Stable in OR.   Delay start of Pharmacological VTE agent (>24hrs) due to surgical blood loss or risk of bleeding: yes

## 2012-03-02 NOTE — Preoperative (Signed)
Beta Blockers   Reason not to administer Beta Blockers:Not Applicable 

## 2012-03-02 NOTE — Anesthesia Postprocedure Evaluation (Deleted)
  Anesthesia Post-op Note  Patient: Victor Oconnor  Procedure(s) Performed: Procedure(s) (LRB): IRRIGATION AND DEBRIDEMENT SHOULDER (Left)  Patient Location: PACU  Anesthesia Type: General  Level of Consciousness: awake and alert   Airway and Oxygen Therapy: Patient Spontanous Breathing  Post-op Pain: mild  Post-op Assessment: Post-op Vital signs reviewed, Patient's Cardiovascular Status Stable, Respiratory Function Stable, Patent Airway and No signs of Nausea or vomiting  Post-op Vital Signs: stable  Complications: No apparent anesthesia complications

## 2012-03-02 NOTE — H&P (Signed)
Victor Oconnor is an 41 y.o. male.   Chief Complaint: left shoulder pain HPI: He is a 41 year old male who had a left shoulder rotator cuff repair on January 14, 2012. He has had continued pain and very limited progress with regaining strength and motion. He denies any new injury. He has also been followed postoperatively for some suture reactions at the incision site. He was placed on doxycycline. The small abscesses were lanced under sterile procedure in the office. Cheesy drainage expelled from the wound. Culture taken which showed corynebacterium. Patient needs re-exploration of left rotator cuff with I&D.   Past Medical History  Diagnosis Date  . Muscle twitch   . Hypothyroidism   . Depression   . Anxiety   . Sleep apnea     prior to gastric bypass 2011- no cpap at present  . Pain     severe pain left shoulder   s/p left shoulder rotator cuff repair on 01/14/12  . Hypertension     no meds for hypertension    Past Surgical History  Procedure Date  . Gastric bypass   . Hernia repair     as a child  . Shoulder open rotator cuff repair 01/14/2012    Procedure: ROTATOR CUFF REPAIR SHOULDER OPEN;  Surgeon: Jacki Cones, MD;  Location: [facility];  Service: Orthopedics;  Laterality: Left;  with graft and anchors    Family History  Problem Relation Age of Onset  . Diabetes Father    Social History:  reports that he quit smoking about 5 years ago. His smoking use included Cigarettes. He has a 8 pack-year smoking history. He has never used smokeless tobacco. He reports that he drinks alcohol. He reports that he does not use illicit drugs.  Allergies:  Allergies  Allergen Reactions  . Penicillins Nausea And Vomiting   Medications Percocet 10-325 q4-6hours PRN for pain Xanax 1mg  PRN anxiety Calcium Carbonate 500 mg  Celexa 20 mg daily Synthroid 200 mcg daily Multivitamin daily Vitamin B12 1000 mcg daily Doxycycline 100mg  BID  Review of Systems  Constitutional: Negative.   HENT:  Negative.  Negative for neck pain.   Eyes: Negative.   Respiratory: Negative.   Cardiovascular: Negative.   Gastrointestinal: Negative.   Genitourinary: Negative.   Musculoskeletal: Positive for joint pain. Negative for myalgias, back pain and falls.  Skin: Positive for itching. Negative for rash.  Neurological: Negative.   Endo/Heme/Allergies: Negative.   Psychiatric/Behavioral: Positive for depression. Negative for suicidal ideas, hallucinations, memory loss and substance abuse. The patient is nervous/anxious. The patient does not have insomnia.     Physical Exam  Constitutional: He is oriented to person, place, and time. He appears well-developed and well-nourished. No distress.  HENT:  Head: Normocephalic and atraumatic.  Right Ear: External ear normal.  Left Ear: External ear normal.  Nose: Nose normal.  Mouth/Throat: Oropharynx is clear and moist.  Eyes: Conjunctivae normal and EOM are normal.  Neck: Normal range of motion. Neck supple.  Cardiovascular: Normal rate, regular rhythm, normal heart sounds and intact distal pulses.   No murmur heard. Respiratory: Effort normal and breath sounds normal. No respiratory distress. He has no wheezes. He has no rales. He exhibits no tenderness.  GI: Soft. Bowel sounds are normal. He exhibits no distension and no mass. There is no tenderness.  Musculoskeletal:       Right shoulder: He exhibits pain. He exhibits normal range of motion, no tenderness and no swelling.  Left shoulder: He exhibits decreased range of motion, tenderness, swelling, pain and decreased strength.       Right elbow: Normal.      Left elbow: Normal.       Right wrist: Normal.       Left wrist: Normal.       Cervical back: Normal.       Right hand: Normal.       Left hand: Normal.  Neurological: He is alert and oriented to person, place, and time. He has normal reflexes.  Skin: No rash noted. He is not diaphoretic. There is erythema.  Psychiatric: He has a  normal mood and affect. His behavior is normal. Judgment and thought content normal.     Assessment/Plan Possible recurrent tear of left rotator cuff and infection Re-exploration of left rotator cuff with I&D  Pamala Hayman LAUREN 03/02/2012, 7:40 AM

## 2012-03-02 NOTE — Interval H&P Note (Signed)
History and Physical Interval Note:  03/02/2012 1:56 PM  Victor Oconnor  has presented today for surgery, with the diagnosis of Left Shoulder Possible Infection  The various methods of treatment have been discussed with the patient and family. After consideration of risks, benefits and other options for treatment, the patient has consented to  Procedure(s) (LRB) with comments: ROTATOR CUFF REPAIR SHOULDER OPEN (Left) - Left Shoulder Exploration of Rotator Cuff as a surgical intervention .  The patient's history has been reviewed, patient examined, no change in status, stable for surgery.  I have reviewed the patient's chart and labs.  Questions were answered to the patient's satisfaction.     [name]

## 2012-03-02 NOTE — Transfer of Care (Signed)
Immediate Anesthesia Transfer of Care Note  Patient: Victor Oconnor  Procedure(s) Performed: Procedure(s) (LRB) with comments: ROTATOR CUFF REPAIR SHOULDER OPEN (Left) - Left Shoulder Exploration of Rotator Cuff IRRIGATION AND DEBRIDEMENT SHOULDER (Left)  Patient Location: PACU  Anesthesia Type: General  Level of Consciousness: awake, alert , oriented, patient cooperative and responds to stimulation  Airway & Oxygen Therapy: Patient Spontanous Breathing and Patient connected to face mask oxygen  Post-op Assessment: Report given to PACU RN, Post -op Vital signs reviewed and stable and Patient moving all extremities X 4  Post vital signs: Reviewed and stable  Complications: No apparent anesthesia complications

## 2012-03-03 DIAGNOSIS — E039 Hypothyroidism, unspecified: Secondary | ICD-10-CM | POA: Diagnosis present

## 2012-03-03 DIAGNOSIS — F329 Major depressive disorder, single episode, unspecified: Secondary | ICD-10-CM | POA: Diagnosis present

## 2012-03-03 DIAGNOSIS — F419 Anxiety disorder, unspecified: Secondary | ICD-10-CM | POA: Diagnosis present

## 2012-03-03 DIAGNOSIS — IMO0002 Reserved for concepts with insufficient information to code with codable children: Secondary | ICD-10-CM

## 2012-03-03 DIAGNOSIS — E669 Obesity, unspecified: Secondary | ICD-10-CM | POA: Diagnosis present

## 2012-03-03 DIAGNOSIS — E785 Hyperlipidemia, unspecified: Secondary | ICD-10-CM | POA: Diagnosis present

## 2012-03-03 DIAGNOSIS — I10 Essential (primary) hypertension: Secondary | ICD-10-CM | POA: Diagnosis present

## 2012-03-03 LAB — CBC
HCT: 38.5 % — ABNORMAL LOW (ref 39.0–52.0)
Hemoglobin: 13 g/dL (ref 13.0–17.0)
MCHC: 33.8 g/dL (ref 30.0–36.0)
MCV: 87.9 fL (ref 78.0–100.0)
RDW: 11.9 % (ref 11.5–15.5)

## 2012-03-03 MED ORDER — VANCOMYCIN HCL IN DEXTROSE 1-5 GM/200ML-% IV SOLN
1000.0000 mg | Freq: Two times a day (BID) | INTRAVENOUS | Status: DC
  Administered 2012-03-03: 1000 mg via INTRAVENOUS
  Filled 2012-03-03: qty 200

## 2012-03-03 MED ORDER — VANCOMYCIN HCL 1000 MG IV SOLR
1250.0000 mg | Freq: Two times a day (BID) | INTRAVENOUS | Status: DC
  Administered 2012-03-03 – 2012-03-05 (×4): 1250 mg via INTRAVENOUS
  Filled 2012-03-03 (×4): qty 1250

## 2012-03-03 MED ORDER — VANCOMYCIN HCL IN DEXTROSE 1-5 GM/200ML-% IV SOLN
1000.0000 mg | Freq: Two times a day (BID) | INTRAVENOUS | Status: DC
  Filled 2012-03-03: qty 200

## 2012-03-03 NOTE — Progress Notes (Signed)
OT Note:  Pt screened for OT.  He had RCR 6 weeks ago and feels comfortable with adls and sling.  [place], OTR/L [phone] 03/03/2012

## 2012-03-03 NOTE — Progress Notes (Signed)
Utilization review completed.  

## 2012-03-03 NOTE — Consult Note (Signed)
[facility]  Total days of antibiotics 6        Day 2 vancomycin               Reason for Consult: Postoperative wound infection of left shoulder    Referring Physician: Dr. Worthy Rancher  Principal Problem:  *Cellulitis of shoulder Active Problems:  Complete rotator cuff rupture of left shoulder  S/P rotator cuff repair  HTN (hypertension)  Dyslipidemia  Hypothyroid  Obesity  Depression  Anxiety      . ALPRAZolam  1 mg Oral QHS  . citalopram  20 mg Oral q morning - 10a  . HYDROmorphone      . HYDROmorphone      . ketorolac      . levothyroxine  200 mcg Oral QAC breakfast  . vancomycin  1,000 mg Intravenous Q12H  . vancomycin  1,000 mg Intravenous Q12H  . DISCONTD: bupivacaine liposome  20 mL Infiltration Once  . DISCONTD: mupirocin ointment   Nasal BID  . DISCONTD: vancomycin  1,000 mg Intravenous Q12H    Recommendations: 1. Continue vancomycin for 3 weeks postoperatively  2. Case manager consult to help arrange outpatient IV vancomycin 3. PICC placement  Assessment:  I feel that we need to manage this as presumed postoperative infection. Corynebacterium is usually considered an insignificant skin colonizer. I agree with continuing empiric vancomycin. This will cover for the most common culprits such as staph aureus and coagulase-negative staph. It will also cover for the outside chance that the corynebacterium is the pathogen. I will plan on at least 3 weeks of postoperative therapy and followup in our clinic. He is in agreement with that plan.   HPI: Victor Oconnor is a 41 y.o. male who underwent left rotator cuff repair on July 31 using a Tissue Mend graft and 3 Stryker anchors. He describes having severe shoulder pain right after surgery that persisted. About 2 weeks later he began to have some redness around his incision with several small "bubbles". There was concern that these might represent stitch abscess sees and on September 12  these areas were opened up in the office in specimens were submitted for culture. Those cultures grew corynebacterium. He was started on doxycycline but had persistent pain and inflammation and was admitted to the hospital yesterday for incision and drainage. The operative note shows that there was no gross purulence but several areas of fluid and small areas of necrosis. Superficial and deep specimens were submitted for culture. Operative Gram stains are negative and cultures are pending. He states that he can already tell a great deal of difference in his pain. He is feeling much better. He has not had any fever, chills or sweats   Review of Systems: Pertinent items are noted in HPI.  Past Medical History  Diagnosis Date  . Muscle twitch   . Hypothyroidism   . Depression   . Anxiety   . Sleep apnea     prior to gastric bypass 2011- no cpap at present  . Pain     severe pain left shoulder   s/p left shoulder rotator cuff repair on 01/14/12  . Hypertension     no meds for hypertension    History  Substance Use Topics  . Smoking status: Former Smoker -- 2.0 packs/day for 4 years    Types: Cigarettes    Quit date: 01/07/2007  . Smokeless tobacco: Never Used  . Alcohol Use: 0.0 oz/week  12 pk week    Family History  Problem Relation Age of Onset  . Diabetes Father    Allergies  Allergen Reactions  . Penicillins Nausea And Vomiting    OBJECTIVE: Blood pressure 118/67, pulse 97, temperature 97.2 F (36.2 C), temperature source Oral, resp. rate 16, height 6' (1.829 m), weight 124.286 kg (274 lb), SpO2 97.00%. General: He is in good spirits and does not appear to be in any discomfort Skin: No rash Lungs: Clear Cor: Regular S1-S2 with no murmurs Abdomen: Soft, obese and nontender His left arm is in a sling and his surgical site over his left shoulder is bandaged  Microbiology: Recent Results (from the past 240 hour(s))  SURGICAL PCR SCREEN     Status: Normal   Collection  Time   03/02/12 11:19 AM      Component Value Range Status Comment   MRSA, PCR NEGATIVE  NEGATIVE Final    Staphylococcus aureus NEGATIVE  NEGATIVE Final   WOUND CULTURE     Status: Normal (Preliminary result)   Collection Time   03/02/12  3:23 PM      Component Value Range Status Comment   Specimen Description WOUND LEFT SHOULDER SAMPLE 2   Final    Special Requests NONE   Final    Gram Stain     Final    Value: RARE WBC PRESENT,BOTH PMN AND MONONUCLEAR     NO SQUAMOUS EPITHELIAL CELLS SEEN     NO ORGANISMS SEEN   Culture NO GROWTH 1 DAY   Final    Report Status PENDING   Incomplete   ANAEROBIC CULTURE     Status: Normal (Preliminary result)   Collection Time   03/02/12  3:23 PM      Component Value Range Status Comment   Specimen Description WOUND LEFT SHOULDER SAMPLE 2   Final    Special Requests NONE   Final    Gram Stain     Final    Value: RARE WBC PRESENT,BOTH PMN AND MONONUCLEAR     NO SQUAMOUS EPITHELIAL CELLS SEEN     NO ORGANISMS SEEN   Culture     Final    Value: No Anaerobes Isolated; Culture in Progress for 14 DAYS   Report Status PENDING   Incomplete   TISSUE CULTURE     Status: Normal (Preliminary result)   Collection Time   03/02/12  3:32 PM      Component Value Range Status Comment   Specimen Description TISSUE LEFT SHOULDER   Final    Special Requests NONE   Final    Gram Stain     Final    Value: NO WBC SEEN     NO ORGANISMS SEEN   Culture NO GROWTH   Final    Report Status PENDING   Incomplete   ANAEROBIC CULTURE     Status: Normal (Preliminary result)   Collection Time   03/02/12  3:32 PM      Component Value Range Status Comment   Specimen Description TISSUE LEFT SHOULDER   Final    Special Requests NONE   Final    Gram Stain     Final    Value: NO WBC SEEN     NO ORGANISMS SEEN   Culture     Final    Value: NO ANAEROBES ISOLATED; CULTURE IN PROGRESS FOR 5 DAYS   Report Status PENDING   Incomplete   WOUND CULTURE     Status: Normal (Preliminary  result)   Collection Time   03/02/12  3:32 PM      Component Value Range Status Comment   Specimen Description WOUND LEFT SHOULDER SAMPLE 1   Final    Special Requests NONE   Final    Gram Stain     Final    Value: RARE WBC PRESENT,BOTH PMN AND MONONUCLEAR     NO SQUAMOUS EPITHELIAL CELLS SEEN     NO ORGANISMS SEEN   Culture NO GROWTH 1 DAY   Final    Report Status PENDING   Incomplete   ANAEROBIC CULTURE     Status: Normal (Preliminary result)   Collection Time   03/02/12  3:32 PM      Component Value Range Status Comment   Specimen Description WOUND LEFT SHOULDER SAMPLE 1   Final    Special Requests NONE   Final    Gram Stain     Final    Value: RARE WBC PRESENT,BOTH PMN AND MONONUCLEAR     NO SQUAMOUS EPITHELIAL CELLS SEEN     NO ORGANISMS SEEN   Culture     Final    Value: No Anaerobes Isolated; Culture in Progress for 14 DAYS   Report Status PENDING   Incomplete     Cliffton Asters, MD [facility] [phone] pager   [phone] cell 03/03/2012, 12:37 PM

## 2012-03-03 NOTE — Progress Notes (Signed)
ANTIBIOTIC CONSULT NOTE - INITIAL  Pharmacy Consult for Vancomycin Indication: Post-op wound infection  Allergies  Allergen Reactions  . Penicillins Nausea And Vomiting   Patient Measurements: Height: 6' (182.9 cm) Weight: 274 lb (124.286 kg) IBW/kg (Calculated) : 77.6   Vital Signs: Temp: 98.1 F (36.7 C) (09/18 1451) Temp src: Oral (09/18 1451) BP: 121/76 mmHg (09/18 1451) Pulse Rate: 85  (09/18 1451) Intake/Output from previous day: 09/17 0701 - 09/18 0700 In: 2095 [P.O.:240; I.V.:1600; IV Piggyback:255] Out: 3380 [Urine:3380] Intake/Output from this shift: Total I/O In: 480 [P.O.:480] Out: 1200 [Urine:1200]  Labs:  Basename 03/03/12 0425 03/02/12 1140  WBC 8.0 5.4  HGB 13.0 14.0  PLT 285 271  LABCREA -- --  CREATININE -- 0.79   Estimated Creatinine Clearance: 165.5 ml/min (by C-G formula based on Cr of 0.79). No results found for this basename: VANCOTROUGH:2,VANCOPEAK:2,VANCORANDOM:2,GENTTROUGH:2,GENTPEAK:2,GENTRANDOM:2,TOBRATROUGH:2,TOBRAPEAK:2,TOBRARND:2,AMIKACINPEAK:2,AMIKACINTROU:2,AMIKACIN:2, in the last 72 hours   Microbiology: Recent Results (from the past 720 hour(s))  SURGICAL PCR SCREEN     Status: Normal   Collection Time   03/02/12 11:19 AM      Component Value Range Status Comment   MRSA, PCR NEGATIVE  NEGATIVE Final    Staphylococcus aureus NEGATIVE  NEGATIVE Final   WOUND CULTURE     Status: Normal (Preliminary result)   Collection Time   03/02/12  3:23 PM      Component Value Range Status Comment   Specimen Description WOUND LEFT SHOULDER SAMPLE 2   Final    Special Requests NONE   Final    Gram Stain     Final    Value: RARE WBC PRESENT,BOTH PMN AND MONONUCLEAR     NO SQUAMOUS EPITHELIAL CELLS SEEN     NO ORGANISMS SEEN   Culture NO GROWTH 1 DAY   Final    Report Status PENDING   Incomplete   ANAEROBIC CULTURE     Status: Normal (Preliminary result)   Collection Time   03/02/12  3:23 PM      Component Value Range Status Comment   Specimen Description WOUND LEFT SHOULDER SAMPLE 2   Final    Special Requests NONE   Final    Gram Stain     Final    Value: RARE WBC PRESENT,BOTH PMN AND MONONUCLEAR     NO SQUAMOUS EPITHELIAL CELLS SEEN     NO ORGANISMS SEEN   Culture     Final    Value: No Anaerobes Isolated; Culture in Progress for 14 DAYS   Report Status PENDING   Incomplete   TISSUE CULTURE     Status: Normal (Preliminary result)   Collection Time   03/02/12  3:32 PM      Component Value Range Status Comment   Specimen Description TISSUE LEFT SHOULDER   Final    Special Requests NONE   Final    Gram Stain     Final    Value: NO WBC SEEN     NO ORGANISMS SEEN   Culture NO GROWTH   Final    Report Status PENDING   Incomplete   ANAEROBIC CULTURE     Status: Normal (Preliminary result)   Collection Time   03/02/12  3:32 PM      Component Value Range Status Comment   Specimen Description TISSUE LEFT SHOULDER   Final    Special Requests NONE   Final    Gram Stain     Final    Value: NO WBC SEEN  NO ORGANISMS SEEN   Culture     Final    Value: NO ANAEROBES ISOLATED; CULTURE IN PROGRESS FOR 5 DAYS   Report Status PENDING   Incomplete   WOUND CULTURE     Status: Normal (Preliminary result)   Collection Time   03/02/12  3:32 PM      Component Value Range Status Comment   Specimen Description WOUND LEFT SHOULDER SAMPLE 1   Final    Special Requests NONE   Final    Gram Stain     Final    Value: RARE WBC PRESENT,BOTH PMN AND MONONUCLEAR     NO SQUAMOUS EPITHELIAL CELLS SEEN     NO ORGANISMS SEEN   Culture NO GROWTH 1 DAY   Final    Report Status PENDING   Incomplete   ANAEROBIC CULTURE     Status: Normal (Preliminary result)   Collection Time   03/02/12  3:32 PM      Component Value Range Status Comment   Specimen Description WOUND LEFT SHOULDER SAMPLE 1   Final    Special Requests NONE   Final    Gram Stain     Final    Value: RARE WBC PRESENT,BOTH PMN AND MONONUCLEAR     NO SQUAMOUS EPITHELIAL CELLS  SEEN     NO ORGANISMS SEEN   Culture     Final    Value: No Anaerobes Isolated; Culture in Progress for 14 DAYS   Report Status PENDING   Incomplete     Medical History: Past Medical History  Diagnosis Date  . Muscle twitch   . Hypothyroidism   . Depression   . Anxiety   . Sleep apnea     prior to gastric bypass 2011- no cpap at present  . Pain     severe pain left shoulder   s/p left shoulder rotator cuff repair on 01/14/12  . Hypertension     no meds for hypertension    Medications:  Anti-infectives     Start     Dose/Rate Route Frequency Ordered Stop   03/03/12 1500   vancomycin (VANCOCIN) IVPB 1000 mg/200 mL premix  Status:  Discontinued        1,000 mg 200 mL/hr over 60 Minutes Intravenous Every 12 hours 03/03/12 0755 03/03/12 0845   03/03/12 1500   vancomycin (VANCOCIN) IVPB 1000 mg/200 mL premix        1,000 mg 200 mL/hr over 60 Minutes Intravenous Every 12 hours 03/03/12 0840     03/03/12 0200   vancomycin (VANCOCIN) IVPB 1000 mg/200 mL premix        1,000 mg 200 mL/hr over 60 Minutes Intravenous Every 12 hours 03/02/12 1711 03/03/12 0350   03/02/12 1356   polymyxin B 500,000 Units, bacitracin 50,000 Units in sodium chloride irrigation 0.9 % 500 mL irrigation  Status:  Discontinued          As needed 03/02/12 1356 03/02/12 1550         Assessment:  40 yom s/p L shoulder surgery/rotator cuff repair 01/14/12 complicated by post-op infection. Corynebacterium cultured by surgeon, treated with Doxycycline po as outpatient.  Wound re-exploration and I&D 9/17, cultures obtained (superficial and deep specimens), then Vancomycin begun.  Patient has received 3 x 1gm Vancomycin scheduled q12 hr.  Requested pharmacy dosing from ID physician, pt will require larger doses, and hope to obtain Vanc trough before discharge.  Vancomycin x 3 week desired, PICC line to be placed.  Goal of  Therapy:  Vancomycin trough level 15-20 mcg/ml  Plan:  Would aim for higher trough  with surgical involvement. Continue Vancomycin at 1250mg  q12. Would prefer trough level before discharge if possible; otherwise very soon after discharge per Home Health.  Otho Bellows PharmD Pager [phone] 03/03/2012,3:38 PM

## 2012-03-03 NOTE — Progress Notes (Signed)
CARE MANAGEMENT NOTE 03/03/2012  Patient:  Victor Oconnor,Victor Oconnor   Account Number:  [id number]  Date Initiated:  03/03/2012  Documentation initiated by:  Colleen Can  Subjective/Objective Assessment:   status post roator cuff repair/shoulder infection; incision and drainage  Referral to [facility] for [facility] services     Action/Plan:   CM spoke with patient. Plans are for patient go to his parents home in [place] where father will offer support. Pt will require Home IV antibiotics. Wants [facility] for [facility] services   Anticipated DC Date:  03/05/2012   Anticipated DC Plan:  HOME W HOME HEALTH SERVICES  In-house referral  NA      DC Planning Services  CM consult      [facility] Choice  HOME HEALTH   Choice offered to / List presented to:  C-1 Patient   DME arranged  IV PUMP/EQUIPMENT        HH arranged  HH-1 RN  IV Antibiotics      HH agency  [facility]   Status of service:  In process, will continue to follow comments:  03/03/2012 Raynelle Bring bsn ccm [place] notified of plan of care for home iv antibiotics. CM will follow

## 2012-03-03 NOTE — Op Note (Signed)
NAMELONELL, CAMPANELLI NO.:  [id number]  MEDICAL RECORD NO.:  [id number]  LOCATION:  1602                         FACILITY:  [facility]  PHYSICIAN:  Georges Lynch. Polette Nofsinger, M.D.DATE OF BIRTH:  [birth year]  DATE OF PROCEDURE:  03/02/2012 DATE OF DISCHARGE:                              OPERATIVE REPORT   PREOPERATIVE DIAGNOSES: 1. Rule out re-rupture of the rotator cuff tendon, left shoulder. 2. Questionable suture reaction to his previous suture. 3. Cellulitis, left shoulder.  POSTOPERATIVE DIAGNOSES: 1. Rule out re-rupture of the rotator cuff tendon, left shoulder. The rotator cuff was intact. 1. Questionable suture reaction. 2. Cellulitis, left shoulder.  OPERATION: 1. Excision of the old scar, left shoulder. 2. Exploration of the left shoulder and cultures were sent for aerobic     and anaerobic sensitivity.  Two separate cultures were taken, 1     from the joint and 1 from the subcutaneous region laterally.  The     only antibiotic he was on was doxycycline at home.  We withheld     antibiotics today until we took these cultures.  After the     cultures, we started him on vancomycin.  PROCEDURE:  Under general anesthesia, the patient in the beach chair position, he first had no antibiotics as I mentioned.  Appropriate time- out was carried out.  I also marked the appropriate left upper extremity in the holding area.  I did an excision of his previous scar initially. I debrided the soft tissue, the fat necrotic areas.  There were no signs of any purulent material.  We first did a thorough cleansing and debridement of the subcutaneous region before we opened the joint. Following that, I made an incision over the anterior aspect of the deltoid at proximal portion.  I went down into the joint with the cultures. There was no fluid in the joint, but I did culture aerobic and anaerobic in the joint.  I examined the anchor, it was intact.  The cup appeared to be intact  as well except for some small area of necrosis. Following that, I went in subcutaneously with a cultures as well laterally.  There were some fluid there, did not appear to be purulent, but I did separate aerobic and anaerobic cultures of that as well. After that, I thoroughly water picked out the shoulder, then irrigated the shoulder with antibiotic solution.  I inserted the Penrose drain.  I reapproximated the proximal deltoid that I opened, and then closed the skin only with #1 nylon suture over a Penrose drain.  Sterile dressings were applied.  The patient was placed in a sling.  Note, he was given vancomycin after the cultures were taken.  Note:  During this procedure, there was no definite signs of any gross infection.  This gentleman had persistent pain in the shoulder, there was a superficial cellulitis. There was questionable suture reaction, and I elected to go ahead and open the shoulder to make sure we were not dealing with a deep infection in the joint.  Initially, he had a severe downsloping and thickening of the acromion.  We had to resect a good portion of the acromion initially because of this  overgrowth.  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  We did follow this gentleman in the office as well every day for 3 or 4 days and had a subcu drain in him after I removed the areas what appeared to be a suture reaction.  Cultures also were taken in the office that showed corynebacterium, which I did discuss this with Infectious Disease, and they said it was a skin contaminant.  His white count remained normal.  He remained afebrile during the entire course of my following him even up to including admission.  So, he will be admitted, await for cultures.  I will keep him on the vancomycin just in case.  Keep him in a sling.          ______________________________ Georges Lynch. Darrelyn Hillock, M.D.     RAG/MEDQ  D:  03/02/2012  T:  03/03/2012   Job:  [id number]

## 2012-03-03 NOTE — Progress Notes (Signed)
Subjective: Awaiting cultures from shoulder. Infectious Disease consult will see today also Afebrile. He is on Vancomycin protocol.WBC is 8.0.   Objective: Vital signs in last 24 hours: Temp:  [97 F (36.1 C)-98.5 F (36.9 C)] 97 F (36.1 C) (09/18 0624) Pulse Rate:  [60-99] 65  (09/18 0624) Resp:  [11-20] 14  (09/18 0624) BP: (125-168)/(75-147) 128/75 mmHg (09/18 0624) SpO2:  [94 %-100 %] 96 % (09/18 0624) Weight:  [124.286 kg (274 lb)-124.456 kg (274 lb 6 oz)] 124.286 kg (274 lb) (09/17 1700)  Intake/Output from previous day: 09/17 0701 - 09/18 0700 In: 2095 [P.O.:240; I.V.:1600; IV Piggyback:255] Out: 3380 [Urine:3380] Intake/Output this shift:     Basename 03/03/12 0425 03/02/12 1140  HGB 13.0 14.0    Basename 03/03/12 0425 03/02/12 1140  WBC 8.0 5.4  RBC 4.38 4.59  HCT 38.5* 41.0  PLT 285 271    Basename 03/02/12 1140  NA 136  K 3.7  CL 99  CO2 26  BUN 8  CREATININE 0.79  GLUCOSE 97  CALCIUM 9.1    Basename 03/02/12 1140  LABPT --  INR 0.98    Neurovascular intact  Assessment/Plan: Drain in and will DC tomorrow.   Avionna Bower A 03/03/2012, 7:57 AM

## 2012-03-04 MED ORDER — VANCOMYCIN HCL 1000 MG IV SOLR: 1250.0000 mg | Freq: Two times a day (BID) | INTRAVENOUS | Status: DC

## 2012-03-04 MED ORDER — METHOCARBAMOL 500 MG PO TABS: 500.0000 mg | ORAL_TABLET | Freq: Four times a day (QID) | ORAL | Status: DC | PRN

## 2012-03-04 MED ORDER — ALPRAZOLAM 1 MG PO TABS
1.0000 mg | ORAL_TABLET | Freq: Once | ORAL | Status: AC
  Administered 2012-03-04: 1 mg via ORAL
  Filled 2012-03-04: qty 1

## 2012-03-04 MED ORDER — OXYCODONE-ACETAMINOPHEN 5-325 MG PO TABS
ORAL_TABLET | ORAL | Status: AC
  Filled 2012-03-04: qty 2

## 2012-03-04 MED ORDER — METHOCARBAMOL 500 MG PO TABS
ORAL_TABLET | ORAL | Status: AC
  Filled 2012-03-04: qty 1

## 2012-03-04 MED ORDER — HEPARIN SOD (PORK) LOCK FLUSH 100 UNIT/ML IV SOLN
250.0000 [IU] | INTRAVENOUS | Status: AC | PRN
  Administered 2012-03-04: 250 [IU]

## 2012-03-04 MED ORDER — SODIUM CHLORIDE 0.9 % IJ SOLN
10.0000 mL | INTRAMUSCULAR | Status: DC | PRN
  Administered 2012-03-04 – 2012-03-05 (×3): 10 mL

## 2012-03-04 NOTE — Progress Notes (Signed)
/  19/2013 Raynelle Bring BSN CCM Pt advises that case is under litigation because Worker's Comp has stop payment of current treatment. He will require home IV abx at home. [facility] company called and will research case to find out is copay required. Spoke with TIFFANY-[phone]. CURRENT prescription for iv abx, h&p, OP report, labs faxed to [phone] as requested with confirmation.  Pt will require vanc trough level drawn in am after 4th dose so will not discharge today. Prescription will have to be refaxed if changed from current prescription that was written by doctor 03/04/2012. Tiffany from [facility] states pt will not have to pay copay up front if there is one and that company could arrange for payment if copay is required. Discussed with patient and he wants to proceed with setting uo arrangements with [facility] for home iv abx. [facility] states they have made arrangement with [facility] in [place] and services could be started tomorrow 03/05/2012 if patient is discharged. CM will follow for change in prescription and refax script if there are changes and if labs are required-fax those orders to [facility].

## 2012-03-04 NOTE — Progress Notes (Signed)
Physical Therapy Note  Order received and chart reviewed.  Pt up in bathroom and moving around independently and reports he does not need PT services at this time.  Pt does report decreased strength and ROM in L shoulder, so recommended outpatient therapy when MD states he is ready and pt agreeable. Since no acute needs, PT to sign off.  Zenovia Jarred, PT Pager: [phone]

## 2012-03-04 NOTE — Progress Notes (Signed)
Patient ID: Victor Oconnor, male   DOB: 1971-01-22, 41 y.o.   MRN: [id number]    [facility]    Date of Admission:  03/02/2012           Day 3 vancomycin Principal Problem:  *Cellulitis of shoulder Active Problems:  Complete rotator cuff rupture of left shoulder  S/P rotator cuff repair  HTN (hypertension)  Dyslipidemia  Hypothyroid  Obesity  Depression  Anxiety      . ALPRAZolam  1 mg Oral QHS  . ALPRAZolam  1 mg Oral Once  . citalopram  20 mg Oral q morning - 10a  . levothyroxine  200 mcg Oral QAC breakfast  . methocarbamol      . oxyCODONE-acetaminophen      . vancomycin  1,250 mg Intravenous Q12H    Subjective: He is feeling much better. There has been a dramatic reduction in his left shoulder pain.  Objective: Temp:  [97 F (36.1 C)-97.8 F (36.6 C)] 97 F (36.1 C) (09/19 1346) Pulse Rate:  [56-65] 56  (09/19 1346) Resp:  [14-16] 14  (09/19 1346) BP: (155-166)/(90-98) 166/98 mmHg (09/19 1346) SpO2:  [94 %] 94 % (09/18 2109)  General: He is alert and smiling Skin: New right arm PICC site looks good Left shoulder bandage clean and dry  Lab Results Lab Results  Component Value Date   WBC 8.0 03/03/2012   HGB 13.0 03/03/2012   HCT 38.5* 03/03/2012   MCV 87.9 03/03/2012   PLT 285 03/03/2012    Lab Results  Component Value Date   CREATININE 0.79 03/02/2012   BUN 8 03/02/2012   NA 136 03/02/2012   K 3.7 03/02/2012   CL 99 03/02/2012   CO2 26 03/02/2012    Lab Results  Component Value Date   ALT 34 03/02/2012   AST 45* 03/02/2012   ALKPHOS 64 03/02/2012   BILITOT 0.3 03/02/2012      Microbiology: Recent Results (from the past 240 hour(s))  SURGICAL PCR SCREEN     Status: Normal   Collection Time   03/02/12 11:19 AM      Component Value Range Status Comment   MRSA, PCR NEGATIVE  NEGATIVE Final    Staphylococcus aureus NEGATIVE  NEGATIVE Final   WOUND CULTURE     Status: Normal (Preliminary result)   Collection Time   03/02/12   3:23 PM      Component Value Range Status Comment   Specimen Description WOUND LEFT SHOULDER SAMPLE 2   Final    Special Requests NONE   Final    Gram Stain     Final    Value: RARE WBC PRESENT,BOTH PMN AND MONONUCLEAR     NO SQUAMOUS EPITHELIAL CELLS SEEN     NO ORGANISMS SEEN   Culture NO GROWTH 1 DAY   Final    Report Status PENDING   Incomplete   ANAEROBIC CULTURE     Status: Normal (Preliminary result)   Collection Time   03/02/12  3:23 PM      Component Value Range Status Comment   Specimen Description WOUND LEFT SHOULDER SAMPLE 2   Final    Special Requests NONE   Final    Gram Stain     Final    Value: RARE WBC PRESENT,BOTH PMN AND MONONUCLEAR     NO SQUAMOUS EPITHELIAL CELLS SEEN     NO ORGANISMS SEEN   Culture     Final    Value: No Anaerobes  Isolated; Culture in Progress for 14 DAYS   Report Status PENDING   Incomplete   TISSUE CULTURE     Status: Normal (Preliminary result)   Collection Time   03/02/12  3:32 PM      Component Value Range Status Comment   Specimen Description TISSUE LEFT SHOULDER   Final    Special Requests NONE   Final    Gram Stain     Final    Value: NO WBC SEEN     NO ORGANISMS SEEN   Culture NO GROWTH 1 DAY   Final    Report Status PENDING   Incomplete   ANAEROBIC CULTURE     Status: Normal (Preliminary result)   Collection Time   03/02/12  3:32 PM      Component Value Range Status Comment   Specimen Description TISSUE LEFT SHOULDER   Final    Special Requests NONE   Final    Gram Stain     Final    Value: NO WBC SEEN     NO ORGANISMS SEEN   Culture     Final    Value: NO ANAEROBES ISOLATED; CULTURE IN PROGRESS FOR 5 DAYS   Report Status PENDING   Incomplete   WOUND CULTURE     Status: Normal (Preliminary result)   Collection Time   03/02/12  3:32 PM      Component Value Range Status Comment   Specimen Description WOUND LEFT SHOULDER SAMPLE 1   Final    Special Requests NONE   Final    Gram Stain     Final    Value: RARE WBC  PRESENT,BOTH PMN AND MONONUCLEAR     NO SQUAMOUS EPITHELIAL CELLS SEEN     NO ORGANISMS SEEN   Culture NO GROWTH 2 DAYS   Final    Report Status PENDING   Incomplete   ANAEROBIC CULTURE     Status: Normal (Preliminary result)   Collection Time   03/02/12  3:32 PM      Component Value Range Status Comment   Specimen Description WOUND LEFT SHOULDER SAMPLE 1   Final    Special Requests NONE   Final    Gram Stain     Final    Value: RARE WBC PRESENT,BOTH PMN AND MONONUCLEAR     NO SQUAMOUS EPITHELIAL CELLS SEEN     NO ORGANISMS SEEN   Culture     Final    Value: No Anaerobes Isolated; Culture in Progress for 14 DAYS   Report Status PENDING   Incomplete    Assessment: He is improving after incision and drainage of his left shoulder. Operative cultures remained negative but this could be because he had been on doxycycline prior to the operation. I will continue vancomycin and plan on at least 4 weeks of therapy. I will not add empiric gram-negative rod therapy unless cultures turn positive for gram-negative rods. I would expect them to grow despite having been on doxycycline recently.  Plan: 1. Continue vancomycin  2. I will sign off now but will arrange followup in our clinic in 2-3 weeks  Cliffton Asters, MD [facility] [phone] pager   [phone] cell 03/04/2012, 4:50 PM

## 2012-03-04 NOTE — Progress Notes (Signed)
Subjective: Drain Dcd and wound looks excellent. Patient states that he feels great! Will have OIC Line and DC Afebrile and WBC has been Normal.   Objective: Vital signs in last 24 hours: Temp:  [97.2 F (36.2 C)-98.1 F (36.7 C)] 97.8 F (36.6 C) (09/18 2109) Pulse Rate:  [65-97] 65  (09/18 2109) Resp:  [16] 16  (09/18 2109) BP: (118-155)/(67-90) 155/90 mmHg (09/18 2109) SpO2:  [94 %-97 %] 94 % (09/18 2109)  Intake/Output from previous day: 09/18 0701 - 09/19 0700 In: 1330 [P.O.:1080; IV Piggyback:250] Out: 1600 [Urine:1600] Intake/Output this shift:     Basename 03/03/12 0425 03/02/12 1140  HGB 13.0 14.0    Basename 03/03/12 0425 03/02/12 1140  WBC 8.0 5.4  RBC 4.38 4.59  HCT 38.5* 41.0  PLT 285 271    Basename 03/02/12 1140  NA 136  K 3.7  CL 99  CO2 26  BUN 8  CREATININE 0.79  GLUCOSE 97  CALCIUM 9.1    Basename 03/02/12 1140  LABPT --  INR 0.98    Neurovascular intact  Assessment/Plan: Awaiting PIC Line and will DC on Vancomycin.   Earlin Sweeden A 03/04/2012, 7:25 AM

## 2012-03-04 NOTE — Progress Notes (Signed)
Peripherally Inserted Central Catheter/Midline Placement  The IV Nurse has discussed with the patient and/or persons authorized to consent for the patient, the purpose of this procedure and the potential benefits and risks involved with this procedure.  The benefits include less needle sticks, lab draws from the catheter and patient may be discharged home with the catheter.  Risks include, but not limited to, infection, bleeding, blood clot (thrombus formation), and puncture of an artery; nerve damage and irregular heat beat.  Alternatives to this procedure were also discussed.  PICC/Midline Placement Documentation        Vevelyn Pat 03/04/2012, 11:01 AM

## 2012-03-05 LAB — WOUND CULTURE: Culture: NO GROWTH

## 2012-03-05 MED ORDER — HEPARIN SOD (PORK) LOCK FLUSH 100 UNIT/ML IV SOLN
250.0000 [IU] | INTRAVENOUS | Status: AC | PRN
  Administered 2012-03-05: 500 [IU]

## 2012-03-05 NOTE — Progress Notes (Signed)
Discharge summary sent to payer through [facility]  

## 2012-03-05 NOTE — Progress Notes (Signed)
ANTIBIOTIC CONSULT NOTE - Follow Up  Pharmacy Consult for Vancomycin Indication: Post-op wound infection  Allergies  Allergen Reactions  . Penicillins Nausea And Vomiting   Patient Measurements: Height: 6' (182.9 cm) Weight: 274 lb (124.286 kg) IBW/kg (Calculated) : 77.6   Vital Signs: Temp: 98.1 F (36.7 C) (09/20 0439) Temp src: Oral (09/20 0439) BP: 170/88 mmHg (09/20 0439) Pulse Rate: 58  (09/20 0439)  Labs:  Basename 03/03/12 0425 03/02/12 1140  WBC 8.0 5.4  HGB 13.0 14.0  PLT 285 271  LABCREA -- --  CREATININE -- 0.79   Estimated Creatinine Clearance: 165.5 ml/min (by C-G formula based on Cr of 0.79).  Basename 03/05/12 0820  VANCOTROUGH 10.7  VANCOPEAK --  VANCORANDOM --  GENTTROUGH --  GENTPEAK --  GENTRANDOM --  TOBRATROUGH --  TOBRAPEAK --  TOBRARND --  AMIKACINPEAK --  AMIKACINTROU --  AMIKACIN --     Microbiology: Recent Results (from the past 720 hour(s))  SURGICAL PCR SCREEN     Status: Normal   Collection Time   03/02/12 11:19 AM      Component Value Range Status Comment   MRSA, PCR NEGATIVE  NEGATIVE Final    Staphylococcus aureus NEGATIVE  NEGATIVE Final   WOUND CULTURE     Status: Normal   Collection Time   03/02/12  3:23 PM      Component Value Range Status Comment   Specimen Description WOUND LEFT SHOULDER SAMPLE 2   Final    Special Requests NONE   Final    Gram Stain     Final    Value: RARE WBC PRESENT,BOTH PMN AND MONONUCLEAR     NO SQUAMOUS EPITHELIAL CELLS SEEN     NO ORGANISMS SEEN   Culture NO GROWTH 2 DAYS   Final    Report Status 03/05/2012 FINAL   Final   ANAEROBIC CULTURE     Status: Normal (Preliminary result)   Collection Time   03/02/12  3:23 PM      Component Value Range Status Comment   Specimen Description WOUND LEFT SHOULDER SAMPLE 2   Final    Special Requests NONE   Final    Gram Stain     Final    Value: RARE WBC PRESENT,BOTH PMN AND MONONUCLEAR     NO SQUAMOUS EPITHELIAL CELLS SEEN     NO ORGANISMS  SEEN   Culture     Final    Value: No Anaerobes Isolated; Culture in Progress for 14 DAYS   Report Status PENDING   Incomplete   TISSUE CULTURE     Status: Normal (Preliminary result)   Collection Time   03/02/12  3:32 PM      Component Value Range Status Comment   Specimen Description TISSUE LEFT SHOULDER   Final    Special Requests NONE   Final    Gram Stain     Final    Value: NO WBC SEEN     NO ORGANISMS SEEN   Culture NO GROWTH 2 DAYS   Final    Report Status PENDING   Incomplete   ANAEROBIC CULTURE     Status: Normal (Preliminary result)   Collection Time   03/02/12  3:32 PM      Component Value Range Status Comment   Specimen Description TISSUE LEFT SHOULDER   Final    Special Requests NONE   Final    Gram Stain     Final    Value: NO WBC SEEN  NO ORGANISMS SEEN   Culture     Final    Value: NO ANAEROBES ISOLATED; CULTURE IN PROGRESS FOR 5 DAYS   Report Status PENDING   Incomplete   WOUND CULTURE     Status: Normal   Collection Time   03/02/12  3:32 PM      Component Value Range Status Comment   Specimen Description WOUND LEFT SHOULDER SAMPLE 1   Final    Special Requests NONE   Final    Gram Stain     Final    Value: RARE WBC PRESENT,BOTH PMN AND MONONUCLEAR     NO SQUAMOUS EPITHELIAL CELLS SEEN     NO ORGANISMS SEEN   Culture NO GROWTH 2 DAYS   Final    Report Status 03/05/2012 FINAL   Final   ANAEROBIC CULTURE     Status: Normal (Preliminary result)   Collection Time   03/02/12  3:32 PM      Component Value Range Status Comment   Specimen Description WOUND LEFT SHOULDER SAMPLE 1   Final    Special Requests NONE   Final    Gram Stain     Final    Value: RARE WBC PRESENT,BOTH PMN AND MONONUCLEAR     NO SQUAMOUS EPITHELIAL CELLS SEEN     NO ORGANISMS SEEN   Culture     Final    Value: No Anaerobes Isolated; Culture in Progress for 14 DAYS   Report Status PENDING   Incomplete     Assessment:  40 yom s/p L shoulder surgery/rotator cuff repair 01/14/12  complicated by post-op infection. Corynebacterium cultured by surgeon, treated with Doxycycline po as outpatient.  Wound re-exploration and I&D 9/17, cultures obtained (superficial and deep specimens), then Vancomycin begun.  ID recommending 4 weeks of IV vancomycin for left shoulder cellulitis.  No cultures have grown anything yet.  Vancomycin trough drawn this AM, was 10.7, at goal for treatment of cellulitis on current dosing of 1250 mg q12h.  Goal of Therapy:  Vancomycin trough level 10-15 mcg/ml  Plan:  Continue Vancomycin at 1250mg  q12h.  Clance Boll, PharmD, BCPS Pager: [phone] 03/05/2012 10:05 AM

## 2012-03-05 NOTE — Progress Notes (Signed)
Pt to d/c home with picc line for antibiotics per order. AVS reviewed and "My Chart" discussed with patient. Pt capable of verbalizing medications and follow-up appointments with Dr. Darrelyn Hillock. Remains hemodynamically stable. No signs and symptoms of distress. Educated pt to return to ER in the case of SOB, dizziness, or chest pain.

## 2012-03-05 NOTE — Progress Notes (Addendum)
Subjective: Doing well/ Dressing changed and wound looks fine.   Objective: Vital signs in last 24 hours: Temp:  [97 F (36.1 C)-98.1 F (36.7 C)] 98.1 F (36.7 C) (09/20 0439) Pulse Rate:  [56-69] 58  (09/20 0439) Resp:  [14] 14  (09/20 0439) BP: (166-176)/(87-98) 170/88 mmHg (09/20 0439) SpO2:  [95 %-97 %] 97 % (09/20 0439)  Intake/Output from previous day: 09/19 0701 - 09/20 0700 In: 850 [P.O.:600; IV Piggyback:250] Out: 925 [Urine:925] Intake/Output this shift:     Basename 03/03/12 0425 03/02/12 1140  HGB 13.0 14.0    Basename 03/03/12 0425 03/02/12 1140  WBC 8.0 5.4  RBC 4.38 4.59  HCT 38.5* 41.0  PLT 285 271    Basename 03/02/12 1140  NA 136  K 3.7  CL 99  CO2 26  BUN 8  CREATININE 0.79  GLUCOSE 97  CALCIUM 9.1    Basename 03/02/12 1140  LABPT --  INR 0.98    No cellulitis present  Assessment/Plan: DC on Vancomycin:   next Wed.   Romaine Neville A 03/05/2012, 7:16 AM

## 2012-03-06 LAB — TISSUE CULTURE: Gram Stain: NONE SEEN

## 2012-03-08 NOTE — Discharge Summary (Signed)
Physician Discharge Summary   Patient ID: Victor Oconnor MRN: [id number] DOB/AGE: August 26, 1970 41 y.o.  Admit date: 03/02/2012 Discharge date: 03/05/2012  Primary Diagnosis:  Cellulitis of left shoulder  Admission Diagnoses:  Past Medical History  Diagnosis Date  . Muscle twitch   . Hypothyroidism   . Depression   . Anxiety   . Sleep apnea     prior to gastric bypass 2011- no cpap at present  . Pain     severe pain left shoulder   s/p left shoulder rotator cuff repair on 01/14/12  . Hypertension     no meds for hypertension   Discharge Diagnoses:   Principal Problem:  *Cellulitis of shoulder Active Problems:  Complete rotator cuff rupture of left shoulder  S/P rotator cuff repair  HTN (hypertension)  Dyslipidemia  Hypothyroid  Obesity  Depression  Anxiety S/P I&D left shoulder  Procedure:  Procedure(s) (LRB): IRRIGATION AND DEBRIDEMENT SHOULDER (Left)   Consults: ID  HPI: He is a 41 year old male who had a left shoulder rotator cuff repair on January 14, 2012. He has had continued pain and very limited progress with regaining strength and motion. He denies any new injury. He has also been followed postoperatively for some suture reactions at the incision site. He was placed on doxycycline. The small abscesses were lanced under sterile procedure in the office. Cheesy drainage expelled from the wound. Culture taken which showed corynebacterium. Patient needed re-exploration of left rotator cuff with I&D.     Laboratory Data: Hospital Outpatient Visit on 01/07/2012  Component Date Value Range Status  . aPTT 01/07/2012 28  24 - 37 seconds Final  . WBC 01/07/2012 5.6  4.0 - 10.5 K/uL Final  . RBC 01/07/2012 4.86  4.22 - 5.81 MIL/uL Final  . Hemoglobin 01/07/2012 15.0  13.0 - 17.0 g/dL Final  . HCT 40/98/1191 43.4  39.0 - 52.0 % Final  . MCV 01/07/2012 89.3  78.0 - 100.0 fL Final  . MCH 01/07/2012 30.9  26.0 - 34.0 pg Final  . MCHC 01/07/2012 34.6  30.0 - 36.0 g/dL Final    . RDW 47/82/9562 12.2  11.5 - 15.5 % Final  . Platelets 01/07/2012 227  150 - 400 K/uL Final  . Sodium 01/07/2012 135  135 - 145 mEq/L Final  . Potassium 01/07/2012 4.1  3.5 - 5.1 mEq/L Final  . Chloride 01/07/2012 99  96 - 112 mEq/L Final  . CO2 01/07/2012 27  19 - 32 mEq/L Final  . Glucose, Bld 01/07/2012 90  70 - 99 mg/dL Final  . BUN 13/01/6577 12  6 - 23 mg/dL Final  . Creatinine, Ser 01/07/2012 0.76  0.50 - 1.35 mg/dL Final  . Calcium 46/96/2952 9.1  8.4 - 10.5 mg/dL Final  . Total Protein 01/07/2012 7.6  6.0 - 8.3 g/dL Final  . Albumin 84/13/2440 4.1  3.5 - 5.2 g/dL Final  . AST 04/12/2535 23  0 - 37 U/L Final  . ALT 01/07/2012 19  0 - 53 U/L Final  . Alkaline Phosphatase 01/07/2012 49  39 - 117 U/L Final  . Total Bilirubin 01/07/2012 0.5  0.3 - 1.2 mg/dL Final  . GFR calc non Af Amer 01/07/2012 >90  >90 mL/min Final  . GFR calc Af Amer 01/07/2012 >90  >90 mL/min Final   Comment:  The eGFR has been calculated                          using the CKD EPI equation.                          This calculation has not been                          validated in all clinical                          situations.                          eGFR's persistently                          <90 mL/min signify                          possible Chronic Kidney Disease.  Marland Kitchen Neutrophils Relative 01/07/2012 56  43 - 77 % Final  . Neutro Abs 01/07/2012 3.1  1.7 - 7.7 K/uL Final  . Lymphocytes Relative 01/07/2012 34  12 - 46 % Final  . Lymphs Abs 01/07/2012 1.9  0.7 - 4.0 K/uL Final  . Monocytes Relative 01/07/2012 7  3 - 12 % Final  . Monocytes Absolute 01/07/2012 0.4  0.1 - 1.0 K/uL Final  . Eosinophils Relative 01/07/2012 3  0 - 5 % Final  . Eosinophils Absolute 01/07/2012 0.2  0.0 - 0.7 K/uL Final  . Basophils Relative 01/07/2012 1  0 - 1 % Final  . Basophils Absolute 01/07/2012 0.1  0.0 - 0.1 K/uL Final  . Prothrombin Time 01/07/2012 13.2  11.6 - 15.2 seconds Final   . INR 01/07/2012 0.98  0.00 - 1.49 Final  . Color, Urine 01/07/2012 YELLOW  YELLOW Final  . APPearance 01/07/2012 CLEAR  CLEAR Final  . Specific Gravity, Urine 01/07/2012 1.016  1.005 - 1.030 Final  . pH 01/07/2012 5.5  5.0 - 8.0 Final  . Glucose, UA 01/07/2012 NEGATIVE  NEGATIVE mg/dL Final  . Hgb urine dipstick 01/07/2012 NEGATIVE  NEGATIVE Final  . Bilirubin Urine 01/07/2012 NEGATIVE  NEGATIVE Final  . Ketones, ur 01/07/2012 NEGATIVE  NEGATIVE mg/dL Final  . Protein, ur 16/03/9603 NEGATIVE  NEGATIVE mg/dL Final  . Urobilinogen, UA 01/07/2012 0.2  0.0 - 1.0 mg/dL Final  . Nitrite 54/02/8118 NEGATIVE  NEGATIVE Final  . Leukocytes, UA 01/07/2012 NEGATIVE  NEGATIVE Final   MICROSCOPIC NOT DONE ON URINES WITH NEGATIVE PROTEIN, BLOOD, LEUKOCYTES, NITRITE, OR GLUCOSE <1000 mg/dL.  Marland Kitchen MRSA, PCR 01/07/2012 NEGATIVE  NEGATIVE Final  . Staphylococcus aureus 01/07/2012 NEGATIVE  NEGATIVE Final   Comment:                                 The Xpert SA Assay (FDA                          approved for NASAL specimens                          only), is one component of  a comprehensive surveillance                          program.  It is not intended                          to diagnose infection nor to                          guide or monitor treatment.   X-Rays:No results found.  EKG: Orders placed during the hospital encounter of 01/14/12  . EKG     Hospital Course: Patient was admitted to [facility] and taken to the OR and underwent the above state procedure without complications.  Patient tolerated the procedure well and was later transferred to the recovery room and then to the orthopaedic floor for postoperative care.  They were given PO and IV analgesics for pain control following their surgery.  He was started on IV vancomycin per pharmacy.  Discharge planning consulted to help with postop disposition and equipment needs.  Patient had a good night on the  evening of surgery, with much reduced pain in the left shoulder. ID consulted on patient recommending continuation of IV vancomycin at home for an additional three weeks.  Drain was advanced post op day one. On post op day two, drain pulled without difficulty. Incision healing well, clean and dry.  By day three, the patient had progressed with therapy and meeting their goals.  Incision was healing well. Patient received dose of IV Vancomycin in am of  Patient was seen in rounds and was ready to go home.  Discharge Medications: Prior to Admission medications   Medication Sig Start Date End Date Taking? Authorizing Provider  ALPRAZolam Prudy Feeler) 1 MG tablet Take 1 mg by mouth. Would take two per day   Yes Historical Provider, MD  citalopram (CELEXA) 20 MG tablet Take 20 mg by mouth every morning.   Yes Historical Provider, MD  levothyroxine (SYNTHROID, LEVOTHROID) 200 MCG tablet Take 200 mcg by mouth daily before breakfast.    Yes Historical Provider, MD  Multiple Vitamin (MULTIVITAMIN WITH MINERALS) TABS Take 2 tablets by mouth daily. chews   Yes Historical Provider, MD  oxyCODONE-acetaminophen (PERCOCET) 10-325 MG per tablet Take 1 tablet by mouth every 4 (four) hours as needed.   Yes Historical Provider, MD  Calcium Carbonate (CALCIUM 500 PO) Take 1,000 mg by mouth daily.    Historical Provider, MD  methocarbamol (ROBAXIN) 500 MG tablet Take 1 tablet (500 mg total) by mouth every 6 (six) hours as needed. 03/04/12   Kharter Sestak Tamala Ser, PA  sodium chloride 0.9 % SOLN 250 mL with vancomycin 1000 MG SOLR 1,250 mg Inject 1,250 mg into the vein every 12 (twelve) hours. 03/04/12   Jatziry Wechter Tamala Ser, PA  vitamin B-12 (CYANOCOBALAMIN) 1000 MCG tablet Take 2,000 mcg by mouth every other day.    Historical Provider, MD    Diet: Regular diet Activity:Keep arm in sling Follow-up:in 1 week Disposition - Home Discharged Condition: good   Discharge Orders    Future Orders Please Complete By Expires    Call MD / Call 911      Comments:   If you experience chest pain or shortness of breath, CALL 911 and be transported to the hospital emergency room.  If you develope a fever above 101 F, pus (white drainage) or increased drainage or  redness at the wound, or calf pain, call your surgeon's office.   Discharge instructions      Comments:   Keep your sling on at all times, including sleeping in your sling. The only time you should remove your sling is to shower only but you need to keep your hand against your chest while you shower.  For the first few days, remove your dressing, tape a piece of saran wrap over your incision, take your shower, then remove the saran wrap and put a clean dressing on, then reapply your sling. After two days you can shower without the saran wrap.  Call Dr. Darrelyn Hillock if any wound complications or temperature of 101 degrees F or over. Call the office for an appointment to see Dr. Darrelyn Hillock in 1 week: [phone] and ask for Dr. Jeannetta Ellis nurse, Mackey Birchwood.   Lifting restrictions      Comments:   No lifting       Medication List     As of 03/08/2012  7:40 AM    TAKE these medications         ALPRAZolam 1 MG tablet   Commonly known as: XANAX   Take 1 mg by mouth. Would take two per day      CALCIUM 500 PO   Take 1,000 mg by mouth daily.      citalopram 20 MG tablet   Commonly known as: CELEXA   Take 20 mg by mouth every morning.      levothyroxine 200 MCG tablet   Commonly known as: SYNTHROID, LEVOTHROID   Take 200 mcg by mouth daily before breakfast.      methocarbamol 500 MG tablet   Commonly known as: ROBAXIN   Take 1 tablet (500 mg total) by mouth every 6 (six) hours as needed.      multivitamin with minerals Tabs   Take 2 tablets by mouth daily. chews      oxyCODONE-acetaminophen 10-325 MG per tablet   Commonly known as: PERCOCET   Take 1 tablet by mouth every 4 (four) hours as needed.      sodium chloride 0.9 % SOLN 250 mL with vancomycin  1000 MG SOLR 1,250 mg   Inject 1,250 mg into the vein every 12 (twelve) hours.      vitamin B-12 1000 MCG tablet   Commonly known as: CYANOCOBALAMIN   Take 2,000 mcg by mouth every other day.         SignedCeledonio Savage, Tenoch Mcclure LAUREN 03/08/2012, 7:40 AM

## 2012-03-16 LAB — ANAEROBIC CULTURE

## 2012-03-31 ENCOUNTER — Inpatient Hospital Stay: Payer: Self-pay | Admitting: Internal Medicine

## 2012-04-06 ENCOUNTER — Inpatient Hospital Stay: Payer: Self-pay | Admitting: Internal Medicine

## 2012-04-14 ENCOUNTER — Encounter: Payer: Self-pay | Admitting: Internal Medicine

## 2012-04-14 ENCOUNTER — Ambulatory Visit (INDEPENDENT_AMBULATORY_CARE_PROVIDER_SITE_OTHER): Payer: BC Managed Care – PPO | Admitting: Internal Medicine

## 2012-04-14 VITALS — BP 150/97 | HR 76 | Temp 97.8°F | Ht 73.0 in | Wt 277.5 lb

## 2012-04-14 DIAGNOSIS — IMO0002 Reserved for concepts with insufficient information to code with codable children: Secondary | ICD-10-CM

## 2012-04-14 DIAGNOSIS — L03119 Cellulitis of unspecified part of limb: Secondary | ICD-10-CM

## 2012-04-14 DIAGNOSIS — Z0000 Encounter for general adult medical examination without abnormal findings: Secondary | ICD-10-CM

## 2012-04-14 LAB — SEDIMENTATION RATE: Sed Rate: 1 mm/hr (ref 0–16)

## 2012-04-14 NOTE — Progress Notes (Signed)
Patient ID: Victor Oconnor, male   DOB: 1970-12-18, 41 y.o.   MRN: [id number]    [facility] for Infectious Disease  Patient Active Problem List  Diagnosis  . Complete rotator cuff rupture of left shoulder  . S/P rotator cuff repair  . Cellulitis of shoulder  . HTN (hypertension)  . Dyslipidemia  . Hypothyroid  . Obesity  . Depression  . Anxiety    Patient's Medications  New Prescriptions   No medications on file  Previous Medications   ALPRAZOLAM (XANAX) 1 MG TABLET    Take 1 mg by mouth. Would take two per day   CALCIUM CARBONATE (CALCIUM 500 PO)    Take 1,000 mg by mouth daily.   CITALOPRAM (CELEXA) 20 MG TABLET    Take 20 mg by mouth every morning.   LEVOTHYROXINE (SYNTHROID, LEVOTHROID) 200 MCG TABLET    Take 200 mcg by mouth daily before breakfast.    METHOCARBAMOL (ROBAXIN) 500 MG TABLET    Take 1 tablet (500 mg total) by mouth every 6 (six) hours as needed.   MULTIPLE VITAMIN (MULTIVITAMIN WITH MINERALS) TABS    Take 2 tablets by mouth daily. chews   OXYCODONE-ACETAMINOPHEN (PERCOCET) 10-325 MG PER TABLET    Take 1 tablet by mouth every 4 (four) hours as needed.   VITAMIN B-12 (CYANOCOBALAMIN) 1000 MCG TABLET    Take 2,000 mcg by mouth every other day.  Modified Medications   No medications on file  Discontinued Medications   SODIUM CHLORIDE 0.9 % SOLN 250 ML WITH VANCOMYCIN 1000 MG SOLR 1,250 MG    Inject 1,250 mg into the vein every 12 (twelve) hours.    Subjective: Victor Oconnor is a 41 y.o. male who underwent left rotator cuff repair on July 31 using a Tissue Mend graft and 3 Stryker anchors. He describes having severe shoulder pain right after surgery that persisted. About 2 weeks later he began to have some redness around his incision with several small "bubbles". There was concern that these might represent stitch abscess sees and on September 12 these areas were opened up in the office in specimens were submitted for culture. Those cultures grew  corynebacterium. He was started on doxycycline but had persistent pain and inflammation and was admitted to the hospital yesterday for incision and drainage. The operative note shows that there was no gross purulence but several areas of fluid and small areas of necrosis. Superficial and deep specimens were submitted for culture. Operative Gram stains and cultures were negative. He was treated with IV vancomycin for 3 weeks postoperatively and had dramatic improvement in his pain. He stopped the vancomycin about 2 weeks ago and several days later started to have recurring pain over the anterior shoulder. It would come intermittently even at rest and be sharp and stabbing. It gets worse with range of motion and he still feels extremely weak and has trouble using his left arm and hand. He has not had any fever, chills, or sweats.     Objective: Temp: 97.8 F (36.6 C) (10/30 1352) Temp src: Oral (10/30 1352) BP: 150/97 mmHg (10/30 1352) Pulse Rate: 76  (10/30 1352)  General: He appears uncomfortable do to his pain Skin: His previous right arm PICC site is healing nicely. He has some healing scabs on the back of his left hand where he scraped his hand on a hot tub while vacationing in [place] last week. He states the lesions are healing nicely. Left shoulder: The surgical incision is healing nicely.  He has a few small pustules over the upper chest. There is no unusual swelling, redness or warmth but he is tender to palpation over the anterior shoulder and tender with range of motion.   Assessment: I am concerned that his recurrent pain could be do to relapsing infection. I discussed the situation with Dr. Worthy Rancher, his orthopedic surgeon. We agreed not to put him back on empiric antibiotics until he can be reevaluated in Dr. Jeannetta Ellis office.  Plan: 1. Observe closely off of antibiotics 2. Check sedimentation rate and C-reactive protein 3. Followup with Dr. Darrelyn Hillock as soon as possible and follow  up with me in one week   Cliffton Asters, MD [facility] for Infectious Disease [facility] [phone] pager   [phone] cell 04/14/2012, 2:24 PM

## 2012-04-16 ENCOUNTER — Telehealth: Payer: Self-pay | Admitting: *Deleted

## 2012-04-16 NOTE — Telephone Encounter (Signed)
Patient called for lab results, ok to give results? Victor Oconnor

## 2012-04-21 NOTE — Telephone Encounter (Signed)
Yes, let him know the lab results are normal.

## 2012-04-22 ENCOUNTER — Ambulatory Visit (INDEPENDENT_AMBULATORY_CARE_PROVIDER_SITE_OTHER): Payer: BC Managed Care – PPO | Admitting: Internal Medicine

## 2012-04-22 ENCOUNTER — Encounter: Payer: Self-pay | Admitting: Internal Medicine

## 2012-04-22 VITALS — BP 132/87 | HR 70 | Temp 97.9°F | Ht 73.0 in | Wt 278.5 lb

## 2012-04-22 DIAGNOSIS — Z9889 Other specified postprocedural states: Secondary | ICD-10-CM

## 2012-04-22 DIAGNOSIS — IMO0002 Reserved for concepts with insufficient information to code with codable children: Secondary | ICD-10-CM

## 2012-04-22 DIAGNOSIS — L03119 Cellulitis of unspecified part of limb: Secondary | ICD-10-CM

## 2012-04-22 NOTE — Progress Notes (Signed)
Patient ID: Victor Oconnor, male   DOB: March 11, 1971, 41 y.o.   MRN: [id number]    [facility] for Infectious Disease  Patient Active Problem List  Diagnosis  . Complete rotator cuff rupture of left shoulder  . S/P rotator cuff repair  . Cellulitis of shoulder  . HTN (hypertension)  . Dyslipidemia  . Hypothyroid  . Obesity  . Depression  . Anxiety    Patient's Medications  New Prescriptions   No medications on file  Previous Medications   ALPRAZOLAM (XANAX) 1 MG TABLET    Take 1 mg by mouth. Would take two per day   CALCIUM CARBONATE (CALCIUM 500 PO)    Take 1,000 mg by mouth daily.   CITALOPRAM (CELEXA) 20 MG TABLET    Take 20 mg by mouth every morning.   DOXYCYCLINE (VIBRAMYCIN) 100 MG CAPSULE    Take 100 mg by mouth 2 (two) times daily.   LEVOTHYROXINE (SYNTHROID, LEVOTHROID) 200 MCG TABLET    Take 200 mcg by mouth daily before breakfast.    METHOCARBAMOL (ROBAXIN) 500 MG TABLET    Take 1 tablet (500 mg total) by mouth every 6 (six) hours as needed.   MULTIPLE VITAMIN (MULTIVITAMIN WITH MINERALS) TABS    Take 2 tablets by mouth daily. chews   OXYCODONE-ACETAMINOPHEN (PERCOCET) 10-325 MG PER TABLET    Take 1 tablet by mouth every 4 (four) hours as needed.   VITAMIN B-12 (CYANOCOBALAMIN) 1000 MCG TABLET    Take 2,000 mcg by mouth every other day.  Modified Medications   No medications on file  Discontinued Medications   No medications on file    Subjective: Victor Oconnor is in for his routine followup visit. Following his visit with me one week ago he saw Dr. Darrelyn Oconnor. He states that he had a plain x-ray done and was started back on oral doxycycline. He says that his pain has gotten progressively worse. Several days ago he raised his arm when laying in bed and felt a "snap". He has not had any fever, chills or sweats.  Objective: Temp: 97.9 F (36.6 C) (11/07 0947) Temp src: Oral (11/07 0947) BP: 132/87 mmHg (11/07 0947) Pulse Rate: 70  (11/07 0947)  General: He appears  uncomfortable due to the pain in his left shoulder Left shoulder. His incision remains red but has healed nicely. There is mild diffuse swelling lateral to the incision with some generalized warmth.  Lab Results  Component Value Date   CRP <0.5 04/14/2012    Lab Results  Component Value Date   ESRSEDRATE 1 04/14/2012    Assessment: I'm not sure why he continues to have pain, swelling and warmth of his shoulder. His inflammatory markers were normal one week ago but he certainly could have some residual infection. I agree with continuing the empiric doxycycline for now. If his pain continues I would suggest an MRI scan looking for any evidence of deep infection.  Plan: 1. Continue doxycycline for now 2. Followup in 2 weeks   Cliffton Asters, MD [facility] for Infectious Disease [facility] [phone] pager   [phone] cell 04/22/2012, 10:05 AM

## 2012-05-11 ENCOUNTER — Ambulatory Visit: Payer: Self-pay | Admitting: Internal Medicine

## 2012-06-17 ENCOUNTER — Telehealth (INDEPENDENT_AMBULATORY_CARE_PROVIDER_SITE_OTHER): Payer: Self-pay | Admitting: Surgery

## 2012-06-17 NOTE — Telephone Encounter (Signed)
05/12/12 mailed recall letter for bariatric surgery follow-up to pt. Advised pt to call [facility] at [phone] to °schedule appt. (lss) ° °

## 2012-11-25 ENCOUNTER — Telehealth (INDEPENDENT_AMBULATORY_CARE_PROVIDER_SITE_OTHER): Payer: Self-pay

## 2012-11-25 NOTE — Telephone Encounter (Signed)
Patient states he vomited a week ago since then he has stomach pains with SOB His PCP done ABD Ct and EKG which was negative and advised him to see G.I physician it may be gallbladder attacks. Scheduled patient Dr. Daphine Deutscher 11/26/12. Patient aware

## 2012-11-26 ENCOUNTER — Ambulatory Visit (INDEPENDENT_AMBULATORY_CARE_PROVIDER_SITE_OTHER): Payer: BC Managed Care – PPO | Admitting: Surgery

## 2012-11-26 ENCOUNTER — Ambulatory Visit (HOSPITAL_COMMUNITY)
Admission: RE | Admit: 2012-11-26 | Discharge: 2012-11-26 | Disposition: A | Discharge status: Home / Self Care | Payer: BC Managed Care – PPO | Source: Ambulatory Visit | Attending: Surgery | Admitting: Surgery

## 2012-11-26 ENCOUNTER — Encounter (HOSPITAL_COMMUNITY): Payer: Self-pay

## 2012-11-26 ENCOUNTER — Encounter (INDEPENDENT_AMBULATORY_CARE_PROVIDER_SITE_OTHER): Payer: Self-pay | Admitting: Surgery

## 2012-11-26 VITALS — BP 120/80 | HR 66 | Temp 98.0°F | Resp 14 | Ht 73.0 in | Wt 266.4 lb

## 2012-11-26 DIAGNOSIS — R109 Unspecified abdominal pain: Secondary | ICD-10-CM | POA: Insufficient documentation

## 2012-11-26 DIAGNOSIS — Z9884 Bariatric surgery status: Secondary | ICD-10-CM

## 2012-11-26 MED ORDER — IOHEXOL 300 MG/ML  SOLN
100.0000 mL | Freq: Once | INTRAMUSCULAR | Status: AC | PRN
  Administered 2012-11-26: 100 mL via INTRAVENOUS

## 2012-11-26 MED ORDER — IOHEXOL 300 MG/ML  SOLN
50.0000 mL | Freq: Once | INTRAMUSCULAR | Status: AC | PRN
  Administered 2012-11-26: 50 mL via ORAL

## 2012-11-26 NOTE — Progress Notes (Signed)
Chief Complaint:  Vomited and started feeling like he was short of breath.  Worked up at [employer] but no paperwork or labs are available  History of Present Illness:  Victor Oconnor is an 42 y.o. male who underwent gastric bypass by me in May of 2011. He's lost approximately 80 pounds. Last week he vomited some slimy material in the food that he indeed in and since then he had a sensation of shortness of breath like it was taken breath away. He's had some gurgling in his abdomen. I think we need to look for evidence of an internal hernia. Get a CT scan abdomen and pelvis.  Past Medical History  Diagnosis Date  . Muscle twitch   . Hypothyroidism   . Depression   . Anxiety   . Sleep apnea     prior to gastric bypass 2011- no cpap at present  . Pain     severe pain left shoulder   s/p left shoulder rotator cuff repair on 01/14/12  . Hypertension     no meds for hypertension  . Hyperlipidemia     Past Surgical History  Procedure Laterality Date  . Gastric bypass    . Hernia repair      as a child  . Shoulder open rotator cuff repair  01/14/2012    Procedure: ROTATOR CUFF REPAIR SHOULDER OPEN;  Surgeon: Jacki Cones, MD;  Location: [facility];  Service: Orthopedics;  Laterality: Left;  with graft and anchors    Current Outpatient Prescriptions  Medication Sig Dispense Refill  . ALPRAZolam (XANAX) 1 MG tablet Take 1 mg by mouth. Would take two per day      . Calcium Carbonate (CALCIUM 500 PO) Take 1,000 mg by mouth daily.      . citalopram (CELEXA) 20 MG tablet Take 20 mg by mouth every morning.      Marland Kitchen HYDROcodone-acetaminophen (VICODIN) 5-500 MG per tablet Take 1 tablet by mouth every 6 (six) hours as needed for pain (prn getting ready to run out).      Marland Kitchen levothyroxine (SYNTHROID, LEVOTHROID) 200 MCG tablet Take 200 mcg by mouth daily before breakfast.       . Multiple Vitamin (MULTIVITAMIN WITH MINERALS) TABS Take 2 tablets by mouth daily. chews       No current  facility-administered medications for this visit.   [place] Family History  Problem Relation Age of Onset  . Diabetes Father    Social History:   reports that he quit smoking about 5 years ago. His smoking use included Cigarettes. He has a 8 pack-year smoking history. He has never used smokeless tobacco. He reports that  drinks alcohol. He reports that he does not use illicit drugs.   REVIEW OF SYSTEMS - PERTINENT POSITIVES ONLY: Non contributory  Physical Exam:   Blood pressure 120/80, pulse 66, temperature 98 F (36.7 C), temperature source Temporal, resp. rate 14, height 6\' 1"  (1.854 m), weight 266 lb 6.4 oz (120.838 kg). Body mass index is 35.15 kg/(m^2).  Gen:  WDWN WM NAD  Neurological: Alert and oriented to person, place, and time. Motor and sensory function is grossly intact  Head: Normocephalic and atraumatic.  Eyes: Conjunctivae are normal. Pupils are equal, round, and reactive to light. No scleral icterus.  Neck: Normal range of motion. Neck supple. No tracheal deviation or thyromegaly present.  Cardiovascular:  SR without murmurs or gallops.  No carotid bruits Respiratory: Effort normal.  No respiratory distress. No chest wall tenderness. Breath sounds normal.  No wheezes, rales or rhonchi.  Abdomen:  Redundant skin with no rebound, guarding or pain.   GU: Musculoskeletal: Normal range of motion. Extremities are nontender. No cyanosis, edema or clubbing noted Lymphadenopathy: No cervical, preauricular, postauricular or axillary adenopathy is present Skin: Skin is warm and dry. No rash noted. No diaphoresis. No erythema. No pallor. Pscyh: Normal mood and affect. Behavior is normal. Judgment and thought content normal.   LABORATORY RESULTS: No results found for this or any previous visit (from the past 48 hour(s)).  RADIOLOGY RESULTS: No results found.  Problem List: Patient Active Problem List   Diagnosis Date Noted  . HTN (hypertension) 03/03/2012  .  Dyslipidemia 03/03/2012  . Hypothyroid 03/03/2012  . Obesity 03/03/2012  . Depression 03/03/2012  . Anxiety 03/03/2012  . Cellulitis of shoulder 03/02/2012  . Complete rotator cuff rupture of left shoulder 01/15/2012  . S/P rotator cuff repair 01/15/2012    Assessment & Plan: Nausea and vomiting in post roux y gastric bypass patient.  Will get CT abdomen and pelvis.      Matt B. Daphine Deutscher, MD, [facility] [phone] beeper [phone]  11/26/2012 5:05 PM

## 2012-11-29 ENCOUNTER — Encounter (INDEPENDENT_AMBULATORY_CARE_PROVIDER_SITE_OTHER): Payer: Self-pay

## 2012-12-01 ENCOUNTER — Encounter (INDEPENDENT_AMBULATORY_CARE_PROVIDER_SITE_OTHER): Payer: Self-pay | Admitting: Surgery

## 2012-12-01 ENCOUNTER — Ambulatory Visit (INDEPENDENT_AMBULATORY_CARE_PROVIDER_SITE_OTHER): Payer: BC Managed Care – PPO | Admitting: Surgery

## 2012-12-01 ENCOUNTER — Encounter (INDEPENDENT_AMBULATORY_CARE_PROVIDER_SITE_OTHER): Payer: Self-pay | Admitting: General Surgery

## 2012-12-01 VITALS — BP 112/78 | HR 68 | Temp 97.2°F | Resp 16 | Ht 73.0 in | Wt 265.4 lb

## 2012-12-01 DIAGNOSIS — K279 Peptic ulcer, site unspecified, unspecified as acute or chronic, without hemorrhage or perforation: Secondary | ICD-10-CM

## 2012-12-01 MED ORDER — PANTOPRAZOLE SODIUM 40 MG PO TBEC: 40.0000 mg | DELAYED_RELEASE_TABLET | Freq: Every day | ORAL | Status: DC

## 2012-12-01 MED ORDER — SUCRALFATE 1 G PO TABS: 1.0000 g | ORAL_TABLET | Freq: Four times a day (QID) | ORAL | Status: DC

## 2012-12-01 NOTE — Progress Notes (Signed)
followup visit after last week's complaints of funny feelings in his throat and difficulty breathing. His CT scan and laboratory both of the chest and abdomen were negative. I agree informed him of this. I want to try him on empiric Protonix and sCarafate to see if this will modify or eradicate his symptoms.  Return in 6 weeks.

## 2012-12-01 NOTE — Patient Instructions (Signed)
I am prescribing to medications to coat and suture stomach pouch. I want to see how they make you feel and so we'll check a back in 2 weeks.

## 2012-12-07 ENCOUNTER — Telehealth (INDEPENDENT_AMBULATORY_CARE_PROVIDER_SITE_OTHER): Payer: Self-pay

## 2012-12-07 NOTE — Telephone Encounter (Signed)
Patient is asking if Protonix or carafate could cause gout flare up. Advised patient those medication would not cause gout flare up but certain foods would such as beef, pork,lamb,tuna,shrimp,lobster,scallops,and beer,liquor. Patient states he did eat shrimp last week. Advised him to call his PCP he may  need to be seen. Patient verbalized understanding.

## 2013-07-06 ENCOUNTER — Encounter (HOSPITAL_COMMUNITY): Payer: Self-pay | Admitting: Pharmacy Technician

## 2013-07-08 ENCOUNTER — Inpatient Hospital Stay (HOSPITAL_COMMUNITY): Admission: RE | Admit: 2013-07-08 | Payer: Self-pay | Source: Ambulatory Visit

## 2013-07-12 ENCOUNTER — Ambulatory Visit (HOSPITAL_COMMUNITY)
Admission: RE | Admit: 2013-07-12 | Payer: BC Managed Care – PPO | Source: Ambulatory Visit | Admitting: Orthopedic Surgery

## 2013-07-12 ENCOUNTER — Encounter (HOSPITAL_COMMUNITY): Admission: RE | Payer: Self-pay | Source: Ambulatory Visit

## 2013-07-12 SURGERY — REPAIR, ROTATOR CUFF, OPEN
Anesthesia: General | Site: Shoulder | Laterality: Right

## 2014-04-14 IMAGING — CR DG SHOULDER 2+V*L*
3 series · 3 of 3 positions shown · non-contrast
Comparison: None.

CLINICAL DATA: Lifting injury, shoulder pain.

LEFT SHOULDER - 2+ VIEW

[view not recorded (1 of 3)]
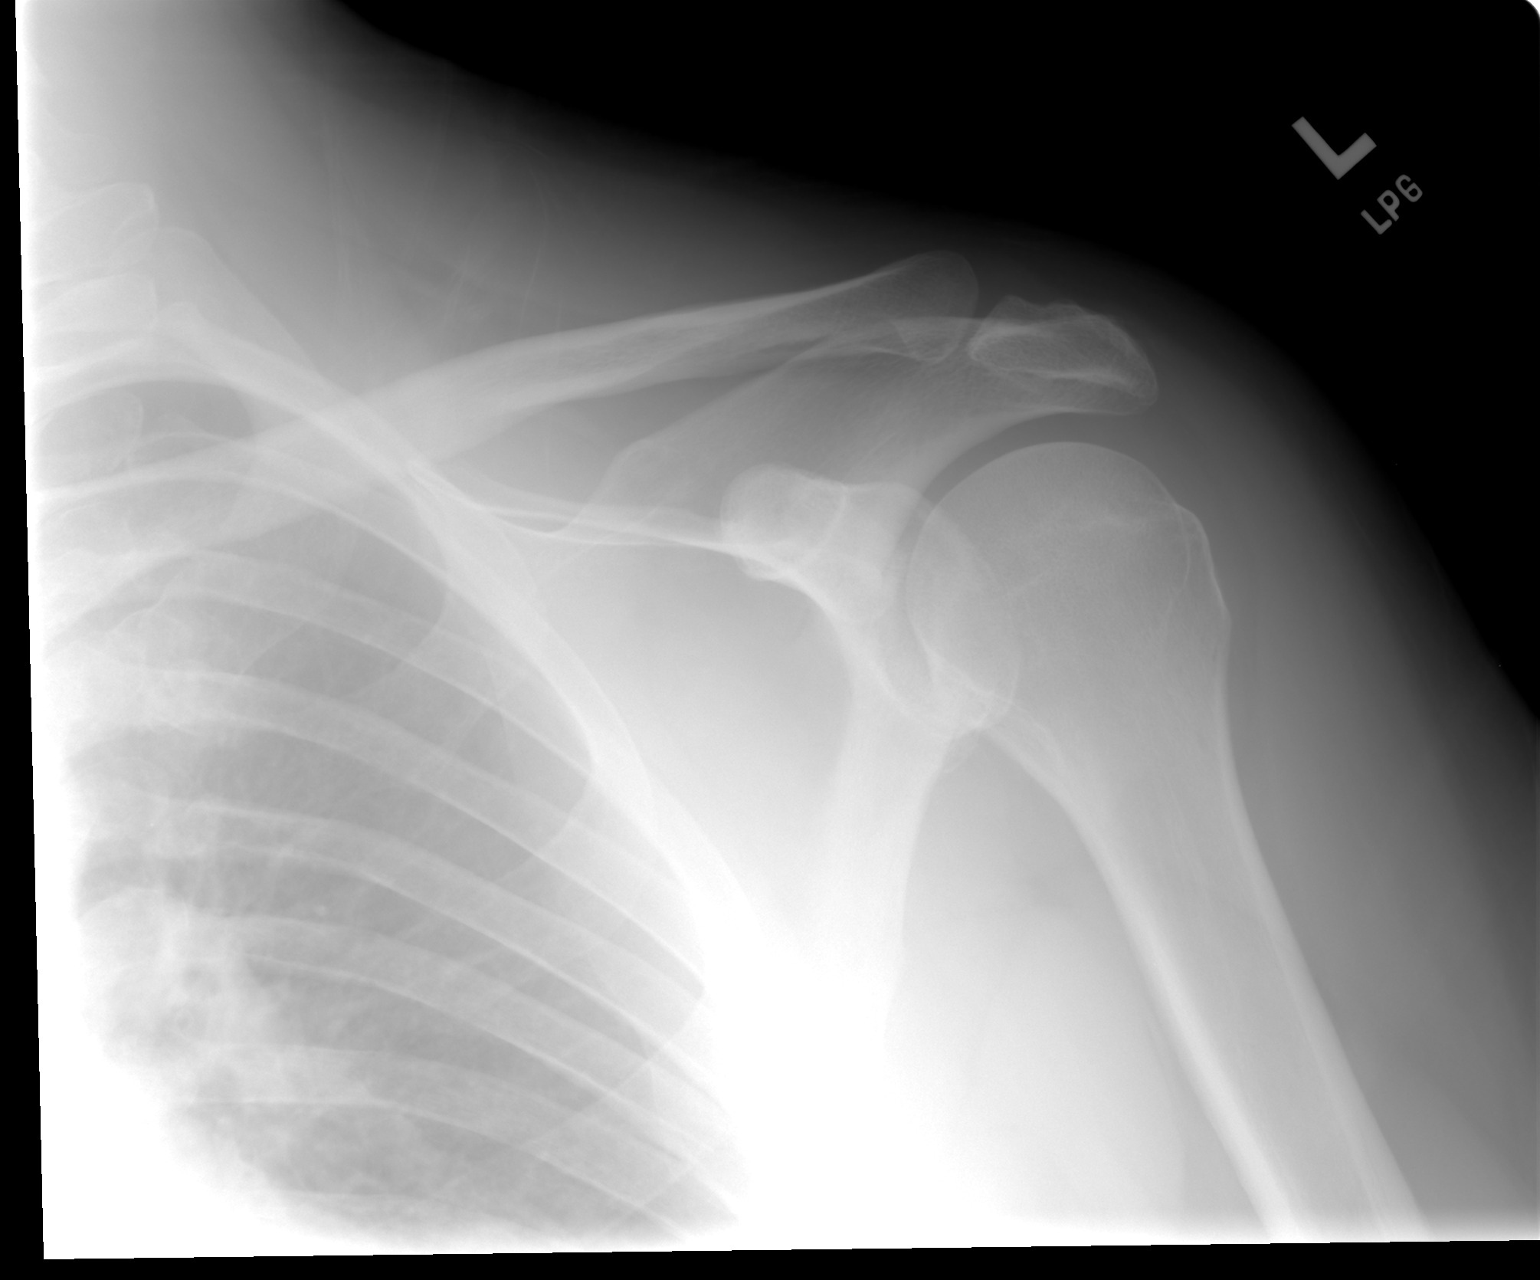

[view not recorded (2 of 3)]
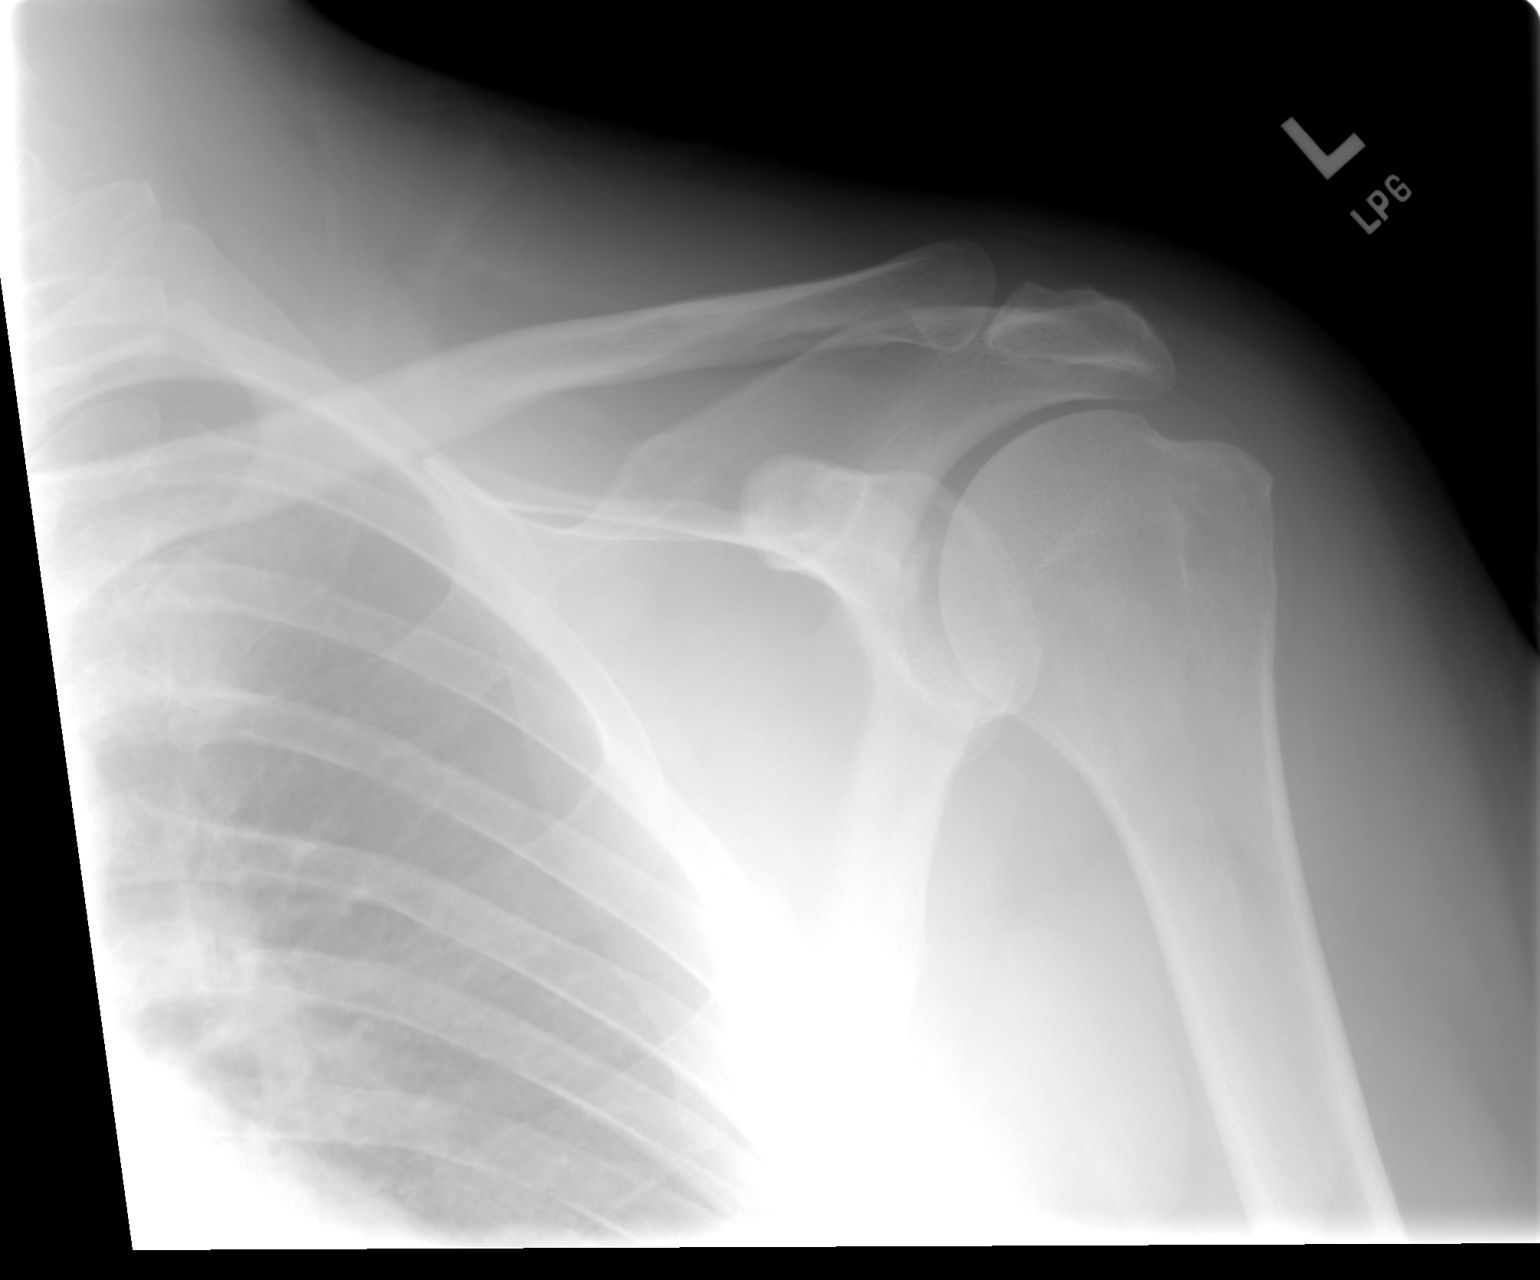

[view not recorded (3 of 3)]
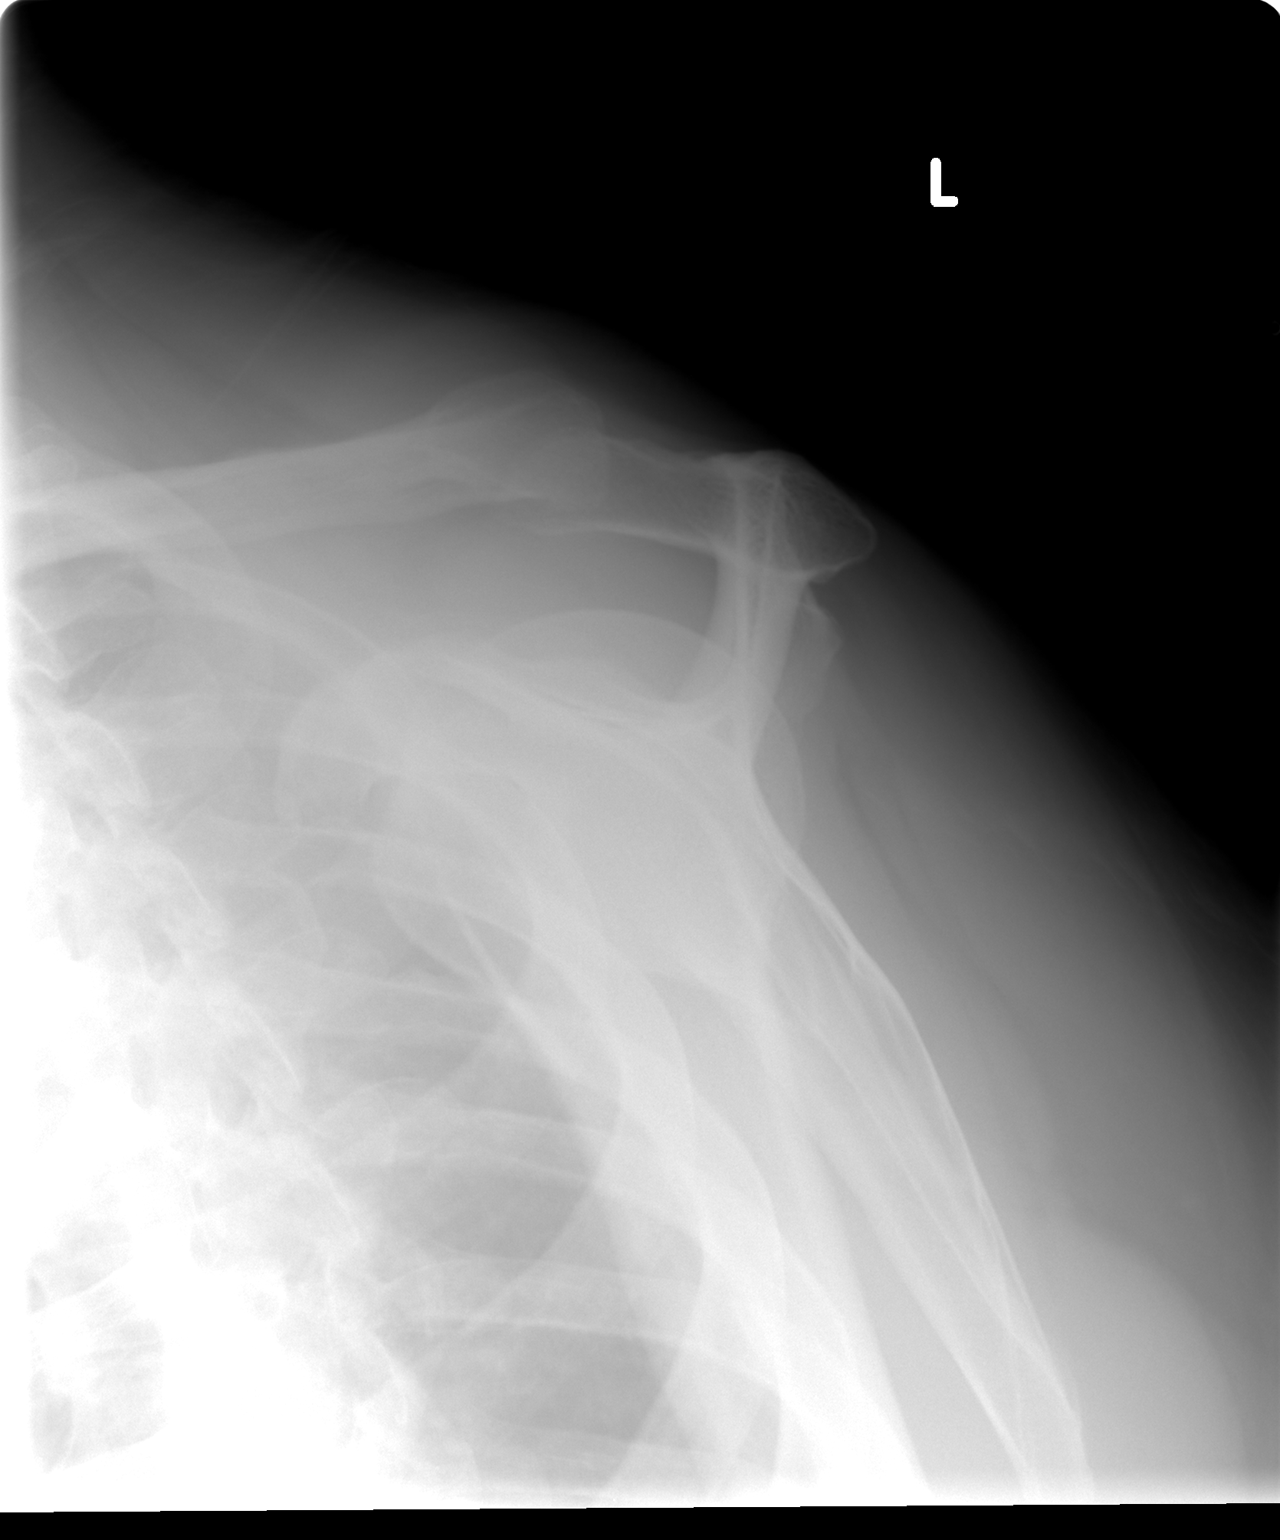

[3 of 3 positions shown; findings below may reference images not displayed]

FINDINGS: No acute bony abnormality.  Specifically, no fracture,
subluxation, or dislocation.  Soft tissues are intact.
IMPRESSION: No acute bony abnormality.

## 2015-11-06 DIAGNOSIS — E039 Hypothyroidism, unspecified: Secondary | ICD-10-CM | POA: Diagnosis not present

## 2015-11-06 DIAGNOSIS — I10 Essential (primary) hypertension: Secondary | ICD-10-CM | POA: Diagnosis not present

## 2015-11-06 DIAGNOSIS — K219 Gastro-esophageal reflux disease without esophagitis: Secondary | ICD-10-CM | POA: Diagnosis not present

## 2015-11-06 DIAGNOSIS — F329 Major depressive disorder, single episode, unspecified: Secondary | ICD-10-CM | POA: Diagnosis not present

## 2015-11-06 DIAGNOSIS — Z6837 Body mass index (BMI) 37.0-37.9, adult: Secondary | ICD-10-CM | POA: Diagnosis not present

## 2015-11-06 DIAGNOSIS — E669 Obesity, unspecified: Secondary | ICD-10-CM | POA: Diagnosis not present

## 2015-11-06 DIAGNOSIS — Z79899 Other long term (current) drug therapy: Secondary | ICD-10-CM | POA: Diagnosis not present

## 2016-04-11 DIAGNOSIS — R05 Cough: Secondary | ICD-10-CM | POA: Diagnosis not present

## 2016-04-25 ENCOUNTER — Encounter (HOSPITAL_COMMUNITY): Payer: Self-pay

## 2016-10-13 DIAGNOSIS — H02833 Dermatochalasis of right eye, unspecified eyelid: Secondary | ICD-10-CM | POA: Diagnosis not present

## 2016-10-13 DIAGNOSIS — H524 Presbyopia: Secondary | ICD-10-CM | POA: Diagnosis not present

## 2016-12-24 DIAGNOSIS — E039 Hypothyroidism, unspecified: Secondary | ICD-10-CM | POA: Diagnosis not present

## 2016-12-24 DIAGNOSIS — Z6835 Body mass index (BMI) 35.0-35.9, adult: Secondary | ICD-10-CM | POA: Diagnosis not present

## 2016-12-24 DIAGNOSIS — Z79899 Other long term (current) drug therapy: Secondary | ICD-10-CM | POA: Diagnosis not present

## 2016-12-24 DIAGNOSIS — F329 Major depressive disorder, single episode, unspecified: Secondary | ICD-10-CM | POA: Diagnosis not present

## 2016-12-24 DIAGNOSIS — D509 Iron deficiency anemia, unspecified: Secondary | ICD-10-CM | POA: Diagnosis not present

## 2016-12-24 DIAGNOSIS — I10 Essential (primary) hypertension: Secondary | ICD-10-CM | POA: Diagnosis not present

## 2017-01-13 DIAGNOSIS — M1990 Unspecified osteoarthritis, unspecified site: Secondary | ICD-10-CM | POA: Diagnosis not present

## 2017-01-13 DIAGNOSIS — G8929 Other chronic pain: Secondary | ICD-10-CM | POA: Diagnosis not present

## 2017-01-13 DIAGNOSIS — L0291 Cutaneous abscess, unspecified: Secondary | ICD-10-CM | POA: Diagnosis not present

## 2017-01-13 DIAGNOSIS — Z79899 Other long term (current) drug therapy: Secondary | ICD-10-CM | POA: Diagnosis not present

## 2017-01-13 DIAGNOSIS — Z6835 Body mass index (BMI) 35.0-35.9, adult: Secondary | ICD-10-CM | POA: Diagnosis not present

## 2017-01-13 DIAGNOSIS — E669 Obesity, unspecified: Secondary | ICD-10-CM | POA: Diagnosis not present

## 2017-02-02 DIAGNOSIS — M1711 Unilateral primary osteoarthritis, right knee: Secondary | ICD-10-CM | POA: Diagnosis not present

## 2017-02-02 DIAGNOSIS — M25561 Pain in right knee: Secondary | ICD-10-CM | POA: Diagnosis not present

## 2017-02-12 DIAGNOSIS — M1711 Unilateral primary osteoarthritis, right knee: Secondary | ICD-10-CM | POA: Diagnosis not present

## 2017-02-21 DIAGNOSIS — M25572 Pain in left ankle and joints of left foot: Secondary | ICD-10-CM | POA: Diagnosis not present

## 2017-02-21 DIAGNOSIS — S99912A Unspecified injury of left ankle, initial encounter: Secondary | ICD-10-CM | POA: Diagnosis not present

## 2017-02-21 DIAGNOSIS — S93602A Unspecified sprain of left foot, initial encounter: Secondary | ICD-10-CM | POA: Diagnosis not present

## 2017-02-21 DIAGNOSIS — M79672 Pain in left foot: Secondary | ICD-10-CM | POA: Diagnosis not present

## 2017-02-21 DIAGNOSIS — S93609A Unspecified sprain of unspecified foot, initial encounter: Secondary | ICD-10-CM | POA: Diagnosis not present

## 2017-02-21 DIAGNOSIS — S99922A Unspecified injury of left foot, initial encounter: Secondary | ICD-10-CM | POA: Diagnosis not present

## 2017-04-29 DIAGNOSIS — M5441 Lumbago with sciatica, right side: Secondary | ICD-10-CM | POA: Diagnosis not present

## 2017-04-29 DIAGNOSIS — M545 Low back pain: Secondary | ICD-10-CM | POA: Diagnosis not present

## 2017-05-01 DIAGNOSIS — M545 Low back pain: Secondary | ICD-10-CM | POA: Diagnosis not present

## 2017-05-04 DIAGNOSIS — M545 Low back pain: Secondary | ICD-10-CM | POA: Diagnosis not present

## 2017-05-14 ENCOUNTER — Inpatient Hospital Stay (HOSPITAL_COMMUNITY): Payer: PPO

## 2017-05-14 ENCOUNTER — Other Ambulatory Visit: Payer: Self-pay

## 2017-05-14 ENCOUNTER — Other Ambulatory Visit: Payer: Self-pay | Admitting: Surgical

## 2017-05-14 ENCOUNTER — Inpatient Hospital Stay (HOSPITAL_COMMUNITY)
Admission: AD | Admit: 2017-05-14 | Discharge: 2017-05-16 | DRG: 520 | Disposition: A | Discharge status: Home / Self Care | Payer: PPO | Source: Ambulatory Visit | Attending: Orthopedic Surgery | Admitting: Orthopedic Surgery

## 2017-05-14 ENCOUNTER — Encounter (HOSPITAL_COMMUNITY): Payer: Self-pay | Admitting: *Deleted

## 2017-05-14 DIAGNOSIS — E039 Hypothyroidism, unspecified: Secondary | ICD-10-CM | POA: Diagnosis present

## 2017-05-14 DIAGNOSIS — M545 Low back pain, unspecified: Secondary | ICD-10-CM

## 2017-05-14 DIAGNOSIS — Z9884 Bariatric surgery status: Secondary | ICD-10-CM | POA: Diagnosis not present

## 2017-05-14 DIAGNOSIS — M5116 Intervertebral disc disorders with radiculopathy, lumbar region: Secondary | ICD-10-CM | POA: Diagnosis not present

## 2017-05-14 DIAGNOSIS — F419 Anxiety disorder, unspecified: Secondary | ICD-10-CM | POA: Diagnosis present

## 2017-05-14 DIAGNOSIS — G4730 Sleep apnea, unspecified: Secondary | ICD-10-CM | POA: Diagnosis not present

## 2017-05-14 DIAGNOSIS — I10 Essential (primary) hypertension: Secondary | ICD-10-CM | POA: Diagnosis not present

## 2017-05-14 DIAGNOSIS — M47816 Spondylosis without myelopathy or radiculopathy, lumbar region: Secondary | ICD-10-CM | POA: Diagnosis not present

## 2017-05-14 DIAGNOSIS — Z833 Family history of diabetes mellitus: Secondary | ICD-10-CM | POA: Diagnosis not present

## 2017-05-14 DIAGNOSIS — Z79890 Hormone replacement therapy (postmenopausal): Secondary | ICD-10-CM

## 2017-05-14 DIAGNOSIS — M48062 Spinal stenosis, lumbar region with neurogenic claudication: Secondary | ICD-10-CM | POA: Diagnosis present

## 2017-05-14 DIAGNOSIS — F329 Major depressive disorder, single episode, unspecified: Secondary | ICD-10-CM | POA: Diagnosis not present

## 2017-05-14 DIAGNOSIS — M7138 Other bursal cyst, other site: Secondary | ICD-10-CM | POA: Diagnosis present

## 2017-05-14 DIAGNOSIS — Z87891 Personal history of nicotine dependence: Secondary | ICD-10-CM

## 2017-05-14 DIAGNOSIS — Z9889 Other specified postprocedural states: Secondary | ICD-10-CM

## 2017-05-14 DIAGNOSIS — M21371 Foot drop, right foot: Secondary | ICD-10-CM | POA: Diagnosis present

## 2017-05-14 DIAGNOSIS — Z419 Encounter for procedure for purposes other than remedying health state, unspecified: Secondary | ICD-10-CM

## 2017-05-14 DIAGNOSIS — Z79899 Other long term (current) drug therapy: Secondary | ICD-10-CM

## 2017-05-14 DIAGNOSIS — Z6833 Body mass index (BMI) 33.0-33.9, adult: Secondary | ICD-10-CM | POA: Diagnosis not present

## 2017-05-14 DIAGNOSIS — Z880 Allergy status to penicillin: Secondary | ICD-10-CM

## 2017-05-14 DIAGNOSIS — M549 Dorsalgia, unspecified: Secondary | ICD-10-CM | POA: Diagnosis present

## 2017-05-14 DIAGNOSIS — E785 Hyperlipidemia, unspecified: Secondary | ICD-10-CM | POA: Diagnosis present

## 2017-05-14 LAB — COMPREHENSIVE METABOLIC PANEL
ALT: 15 U/L — ABNORMAL LOW (ref 17–63)
AST: 16 U/L (ref 15–41)
Albumin: 3.6 g/dL (ref 3.5–5.0)
Alkaline Phosphatase: 49 U/L (ref 38–126)
Anion gap: 6 (ref 5–15)
BUN: 12 mg/dL (ref 6–20)
CO2: 29 mmol/L (ref 22–32)
Calcium: 8.8 mg/dL — ABNORMAL LOW (ref 8.9–10.3)
Chloride: 101 mmol/L (ref 101–111)
Creatinine, Ser: 0.8 mg/dL (ref 0.61–1.24)
GFR calc Af Amer: >60 mL/min (ref >60)
GFR calc non Af Amer: >60 mL/min (ref >60)
Glucose, Bld: 115 mg/dL — ABNORMAL HIGH (ref 65–99)
Potassium: 4.1 mmol/L (ref 3.5–5.1)
Sodium: 136 mmol/L (ref 135–145)
Total Bilirubin: 0.3 mg/dL (ref 0.3–1.2)
Total Protein: 7 g/dL (ref 6.5–8.1)

## 2017-05-14 LAB — CBC WITH DIFFERENTIAL/PLATELET
Basophils Absolute: 0.1 10*3/uL (ref 0.0–0.1)
Basophils Relative: 0 %
Eosinophils Absolute: 0.1 10*3/uL (ref 0.0–0.7)
Eosinophils Relative: 1 %
HCT: 36.9 % — ABNORMAL LOW (ref 39.0–52.0)
Hemoglobin: 11.8 g/dL — ABNORMAL LOW (ref 13.0–17.0)
Lymphocytes Relative: 15 %
Lymphs Abs: 1.8 10*3/uL (ref 0.7–4.0)
MCH: 26.5 pg (ref 26.0–34.0)
MCHC: 32 g/dL (ref 30.0–36.0)
MCV: 82.7 fL (ref 78.0–100.0)
Monocytes Absolute: 0.5 10*3/uL (ref 0.1–1.0)
Monocytes Relative: 4 %
Neutro Abs: 9.7 10*3/uL — ABNORMAL HIGH (ref 1.7–7.7)
Neutrophils Relative %: 80 %
Platelets: 285 10*3/uL (ref 150–400)
RBC: 4.46 MIL/uL (ref 4.22–5.81)
RDW: 15.7 % — ABNORMAL HIGH (ref 11.5–15.5)
WBC: 12.1 10*3/uL — ABNORMAL HIGH (ref 4.0–10.5)

## 2017-05-14 LAB — SURGICAL PCR SCREEN
MRSA, PCR: NEGATIVE
STAPHYLOCOCCUS AUREUS: POSITIVE — AB

## 2017-05-14 LAB — PROTIME-INR
INR: 0.93
Prothrombin Time: 12.4 seconds (ref 11.4–15.2)

## 2017-05-14 LAB — APTT: aPTT: 27 seconds (ref 24–36)

## 2017-05-14 MED ORDER — LACTATED RINGERS IV SOLN
INTRAVENOUS | Status: DC
  Administered 2017-05-15: 13:00:00 via INTRAVENOUS

## 2017-05-14 MED ORDER — POLYETHYLENE GLYCOL 3350 17 G PO PACK: 17.0000 g | PACK | Freq: Every day | ORAL | Status: DC | PRN

## 2017-05-14 MED ORDER — DOCUSATE SODIUM 100 MG PO CAPS
100.0000 mg | ORAL_CAPSULE | Freq: Two times a day (BID) | ORAL | Status: DC
  Administered 2017-05-14 – 2017-05-16 (×3): 100 mg via ORAL
  Filled 2017-05-14 (×3): qty 1

## 2017-05-14 MED ORDER — SODIUM CHLORIDE 0.9 % IV SOLN: INTRAVENOUS | Status: DC

## 2017-05-14 MED ORDER — VANCOMYCIN HCL IN DEXTROSE 1-5 GM/200ML-% IV SOLN
1000.0000 mg | INTRAVENOUS | Status: AC
  Administered 2017-05-15: 1000 mg via INTRAVENOUS
  Filled 2017-05-14: qty 200

## 2017-05-14 MED ORDER — SODIUM CHLORIDE 0.9% FLUSH
3.0000 mL | Freq: Two times a day (BID) | INTRAVENOUS | Status: DC
  Administered 2017-05-14: 3 mL via INTRAVENOUS

## 2017-05-14 MED ORDER — FLEET ENEMA 7-19 GM/118ML RE ENEM: 1.0000 | ENEMA | Freq: Once | RECTAL | Status: DC | PRN

## 2017-05-14 MED ORDER — OXYCODONE-ACETAMINOPHEN 5-325 MG PO TABS
1.0000 | ORAL_TABLET | ORAL | Status: DC | PRN
  Administered 2017-05-14 – 2017-05-16 (×7): 2 via ORAL
  Filled 2017-05-14 (×7): qty 2

## 2017-05-14 MED ORDER — SODIUM CHLORIDE 0.9 % IV SOLN: 250.0000 mL | INTRAVENOUS | Status: DC | PRN

## 2017-05-14 MED ORDER — BUPIVACAINE LIPOSOME 1.3 % IJ SUSP
20.0000 mL | Freq: Once | INTRAMUSCULAR | Status: DC
Start: 2017-05-15 — End: 2017-05-16
  Filled 2017-05-14: qty 20

## 2017-05-14 MED ORDER — CHLORHEXIDINE GLUCONATE 4 % EX LIQD: 60.0000 mL | Freq: Once | CUTANEOUS | Status: DC

## 2017-05-14 MED ORDER — ONDANSETRON HCL 4 MG/2ML IJ SOLN: 4.0000 mg | Freq: Four times a day (QID) | INTRAMUSCULAR | Status: DC | PRN

## 2017-05-14 MED ORDER — ACETAMINOPHEN 325 MG PO TABS: 650.0000 mg | ORAL_TABLET | Freq: Four times a day (QID) | ORAL | Status: DC | PRN

## 2017-05-14 MED ORDER — BISACODYL 10 MG RE SUPP: 10.0000 mg | Freq: Every day | RECTAL | Status: DC | PRN

## 2017-05-14 MED ORDER — ONDANSETRON HCL 4 MG PO TABS: 4.0000 mg | ORAL_TABLET | Freq: Four times a day (QID) | ORAL | Status: DC | PRN

## 2017-05-14 MED ORDER — SODIUM CHLORIDE 0.9% FLUSH: 3.0000 mL | INTRAVENOUS | Status: DC | PRN

## 2017-05-14 MED ORDER — HYDROCODONE-ACETAMINOPHEN 5-325 MG PO TABS
1.0000 | ORAL_TABLET | ORAL | Status: DC | PRN
  Administered 2017-05-16: 04:00:00 2 via ORAL
  Filled 2017-05-14: qty 2

## 2017-05-14 MED ORDER — ACETAMINOPHEN 650 MG RE SUPP: 650.0000 mg | Freq: Four times a day (QID) | RECTAL | Status: DC | PRN

## 2017-05-14 NOTE — Progress Notes (Unsigned)
Victor Oconnor is an 46 y.o. male.   Chief Complaint: low back pain HPI: The patient reports low back symptoms including low back pain which began 2 weeks ago without any known injury. Symptoms include pain. The pain radiates to the right buttock, right thigh and right lower leg. The patient describes the severity of their symptoms as moderate in severity. Prior to being seen today the patient was previously evaluated in the emergency room. Symptoms present at the patient's previous evaluation included back pain. Past evaluation has included x-ray of the lumbar spine. Past treatment has included corticosteroids. He reports that he did not recall an injury or change in activity that could have caused the pain. He says that the pain is continuing to get worse despite rest and Prednisone. He says that he has had flares of pain in his low back before but not this severe. He had an MRI in 2104 which showed a disc bulge at L4-L5, L5-S1 but no specific treatment at that time other than activity modification. No change in bladder or bowel function. He is having pain at all times. He is unable to sleep. He feels like the right leg is going to give out on him. He has numbness in the right lower leg. MRI showed a large herniated disc at L4-L5 on the right. He was scheduled for surgery in later December. Unfortunately, he was experiencing severe pain that required admission to the hospital today.      Past Medical History:  Diagnosis Date  . Anxiety   . Depression   . Hyperlipidemia   . Hypertension    no meds for hypertension  . Hypothyroidism   . Muscle twitch   . Pain    severe pain left shoulder   s/p left shoulder rotator cuff repair on 01/14/12  . Sleep apnea    prior to gastric bypass 2011- no cpap at present         Past Surgical History:  Procedure Laterality Date  . GASTRIC BYPASS    . HERNIA REPAIR     as a child  . SHOULDER OPEN ROTATOR CUFF REPAIR  01/14/2012    Procedure: ROTATOR CUFF REPAIR SHOULDER OPEN;  Surgeon: Jacki Conesonald A Gioffre, MD;  Location: [facility];  Service: Orthopedics;  Laterality: Left;  with graft and anchors         Family History  Problem Relation Age of Onset  . Diabetes Father    Social History:  reports that he quit smoking about 10 years ago. His smoking use included cigarettes. He has a 8.00 pack-year smoking history. he has never used smokeless tobacco. He reports that he drinks alcohol. He reports that he does not use drugs.  Allergies:      Allergies  Allergen Reactions  . Penicillins Nausea And Vomiting      Current Outpatient Medications:  .  ALPRAZolam (XANAX) 1 MG tablet, Take 1 mg by mouth at bedtime as needed for anxiety. , Disp: , Rfl:  .  buPROPion (WELLBUTRIN SR) 150 MG 12 hr tablet, Take 150 mg by mouth every morning., Disp: , Rfl:  .  Calcium Carbonate (CALCIUM 500 PO), Take 1,000 mg by mouth daily., Disp: , Rfl:  .  escitalopram (LEXAPRO) 5 MG tablet, Take 5 mg by mouth every morning., Disp: , Rfl:  .  gabapentin (NEURONTIN) 600 MG tablet, Take 600 mg by mouth 4 (four) times daily., Disp: , Rfl:  .  levothyroxine (SYNTHROID, LEVOTHROID) 200 MCG tablet, Take 200 mcg  by mouth daily before breakfast. , Disp:  .  lisinopril (PRINIVIL,ZESTRIL) 10 MG tablet, Take 10 mg by mouth every morning., Disp: , Rfl:  .  Multiple Vitamin (MULTIVITAMIN WITH MINERALS) TABS, Take 2 tablets by mouth daily. , Disp: , Rfl:  .  traMADol (ULTRAM) 50 MG tablet, Take 50-100 mg by mouth every 6 (six) hours as needed for moderate pain.,    Review of Systems  Constitutional: Negative for chills, diaphoresis, fever, malaise/fatigue and weight loss.  HENT: Negative.   Eyes: Negative.   Respiratory: Negative.   Cardiovascular: Negative.   Gastrointestinal: Negative.   Genitourinary: Negative.   Musculoskeletal: Positive for back pain and myalgias. Negative for falls, joint pain and neck pain.  Skin: Negative.     Neurological: Positive for tingling, sensory change and weakness. Negative for dizziness, tremors, speech change, focal weakness, seizures, loss of consciousness and headaches.  Endo/Heme/Allergies: Negative.   Psychiatric/Behavioral: Positive for depression. Negative for hallucinations, memory loss, substance abuse and suicidal ideas. The patient is nervous/anxious. The patient does not have insomnia.     Physical Exam  Constitutional: He is oriented to person, place, and time. He appears well-developed and well-nourished. No distress.  HENT:  Head: Normocephalic and atraumatic.  Right Ear: External ear normal.  Left Ear: External ear normal.  Nose: Nose normal.  Mouth/Throat: Oropharynx is clear and moist.  Eyes: Conjunctivae and EOM are normal.  Neck: Normal range of motion. Neck supple.  Cardiovascular: Normal rate, regular rhythm, normal heart sounds and intact distal pulses.  No murmur heard. Respiratory: Effort normal and breath sounds normal. No respiratory distress. He has no wheezes.  GI: Soft. Bowel sounds are normal. He exhibits no distension. There is no tenderness.  Musculoskeletal:  He has significant pain with motion of the lumbar spine, causing radicular pain into the right buttocks and posterior thigh and anterior lower leg. Calves soft and nontender. Normal painless motion of the hips and knees but positive SLR on the right side. No bony tenderness over the lumbar spine  Neurological: He is alert and oriented to person, place, and time. A sensory deficit is present.  Weakness of the EHL on the right foot. No other motor deficits noted. Decreased sensation to light touch along the L5 nerve distribution in the right lower leg.  Skin: No rash noted. He is not diaphoretic. No erythema.  Psychiatric: He has a normal mood and affect. His behavior is normal.    Vitals   Assessment/Plan Lumbar disc herniation L4-L5, right [name] has a large disc herniation at L4-L5  on the right. He needs hospital admission today for pain management and we will proceed with hemilaminectomy and microdiscectomy L4-L5 right tomorrow. Quesitons and concerns addressed with the patient and he has elected to move forward with this plan.   CONSTABLE, AMBER LAUREN, PA-C 05/14/2017, 8:31 AM               Pre-admit on 06/10/2017        Detailed Report

## 2017-05-14 NOTE — H&P (Signed)
Chief Complaint: low back pain HPI: The patient reports low back symptoms including low back pain which began 2 weeks ago without any known injury. Symptoms include pain. The pain radiates to the right buttock, right thigh and right lower leg. The patient describes the severity of their symptoms as moderate in severity. Prior to being seen today the patient was previously evaluated in the emergency room. Symptoms present at the patient's previous evaluation included back pain. Past evaluation has included x-ray of the lumbar spine. Past treatment has included corticosteroids. He reports that he did not recall an injury or change in activity that could have caused the pain. He says that the pain is continuing to get worse despite rest and Prednisone. He says that he has had flares of pain in his low back before but not this severe. He had an MRI in 2104 which showed a disc bulge at L4-L5, L5-S1 but no specific treatment at that time other than activity modification. No change in bladder or bowel function. He is having pain at all times. He is unable to sleep. He feels like the right leg is going to give out on him. He has numbness in the right lower leg. MRI showed a large herniated disc at L4-L5 on the right. He was scheduled for surgery in later December. Unfortunately, he was experiencing severe pain that required admission to the hospital today.      Past Medical History:  Diagnosis Date  . Anxiety   . Depression   . Hyperlipidemia   . Hypertension    no meds for hypertension  . Hypothyroidism   . Muscle twitch   . Pain    severe pain left shoulder   s/p left shoulder rotator cuff repair on 01/14/12  . Sleep apnea    prior to gastric bypass 2011- no cpap at present         Past Surgical History:  Procedure Laterality Date  . GASTRIC BYPASS    . HERNIA REPAIR     as a child  . SHOULDER OPEN ROTATOR CUFF REPAIR  01/14/2012   Procedure: ROTATOR CUFF REPAIR SHOULDER OPEN;   Surgeon: Jacki Conesonald A Gioffre, MD;  Location: [facility];  Service: Orthopedics;  Laterality: Left;  with graft and anchors         Family History  Problem Relation Age of Onset  . Diabetes Father    Social History:  reports that he quit smoking about 10 years ago. His smoking use included cigarettes. He has a 8.00 pack-year smoking history. he has never used smokeless tobacco. He reports that he drinks alcohol. He reports that he does not use drugs.  Allergies:      Allergies  Allergen Reactions  . Penicillins Nausea And Vomiting      Current Outpatient Medications:  .  ALPRAZolam (XANAX) 1 MG tablet, Take 1 mg by mouth at bedtime as needed for anxiety. , Disp: , Rfl:  .  buPROPion (WELLBUTRIN SR) 150 MG 12 hr tablet, Take 150 mg by mouth every morning., Disp: , Rfl:  .  Calcium Carbonate (CALCIUM 500 PO), Take 1,000 mg by mouth daily., Disp: , Rfl:  .  escitalopram (LEXAPRO) 5 MG tablet, Take 5 mg by mouth every morning., Disp: , Rfl:  .  gabapentin (NEURONTIN) 600 MG tablet, Take 600 mg by mouth 4 (four) times daily., Disp: , Rfl:  .  levothyroxine (SYNTHROID, LEVOTHROID) 200 MCG tablet, Take 200 mcg by mouth daily before breakfast. , Disp:  .  lisinopril (PRINIVIL,ZESTRIL) 10 MG tablet, Take 10 mg by mouth every morning., Disp: , Rfl:  .  Multiple Vitamin (MULTIVITAMIN WITH MINERALS) TABS, Take 2 tablets by mouth daily. , Disp: , Rfl:  .  traMADol (ULTRAM) 50 MG tablet, Take 50-100 mg by mouth every 6 (six) hours as needed for moderate pain.,    Review of Systems  Constitutional: Negative for chills, diaphoresis, fever, malaise/fatigue and weight loss.  HENT: Negative.   Eyes: Negative.   Respiratory: Negative.   Cardiovascular: Negative.   Gastrointestinal: Negative.   Genitourinary: Negative.   Musculoskeletal: Positive for back pain and myalgias. Negative for falls, joint pain and neck pain.  Skin: Negative.   Neurological: Positive for tingling, sensory change and  weakness. Negative for dizziness, tremors, speech change, focal weakness, seizures, loss of consciousness and headaches.  Endo/Heme/Allergies: Negative.   Psychiatric/Behavioral: Positive for depression. Negative for hallucinations, memory loss, substance abuse and suicidal ideas. The patient is nervous/anxious. The patient does not have insomnia.     Physical Exam  Constitutional: He is oriented to person, place, and time. He appears well-developed and well-nourished. No distress.  HENT:  Head: Normocephalic and atraumatic.  Right Ear: External ear normal.  Left Ear: External ear normal.  Nose: Nose normal.  Mouth/Throat: Oropharynx is clear and moist.  Eyes: Conjunctivae and EOM are normal.  Neck: Normal range of motion. Neck supple.  Cardiovascular: Normal rate, regular rhythm, normal heart sounds and intact distal pulses.  No murmur heard. Respiratory: Effort normal and breath sounds normal. No respiratory distress. He has no wheezes.  GI: Soft. Bowel sounds are normal. He exhibits no distension. There is no tenderness.  Musculoskeletal:  He has significant pain with motion of the lumbar spine, causing radicular pain into the right buttocks and posterior thigh and anterior lower leg. Calves soft and nontender. Normal painless motion of the hips and knees but positive SLR on the right side. No bony tenderness over the lumbar spine  Neurological: He is alert and oriented to person, place, and time. A sensory deficit is present.  Weakness of the EHL on the right foot. No other motor deficits noted. Decreased sensation to light touch along the L5 nerve distribution in the right lower leg.  Skin: No rash noted. He is not diaphoretic. No erythema.  Psychiatric: He has a normal mood and affect. His behavior is normal.    Vitals WT 252 H 73" BP 126/82  Assessment/Plan Lumbar disc herniation L4-L5, right [name] has a large disc herniation at L4-L5 on the right. He needs hospital  admission today for pain management and we will proceed with hemilaminectomy and microdiscectomy L4-L5 right tomorrow. Quesitons and concerns addressed with the patient and he has elected to move forward with this plan.   CONSTABLE, AMBER LAUREN, PA-C 05/14/2017, 8:31 AM               Pre-admit on 05/14/2017        Detailed Report

## 2017-05-14 NOTE — H&P (Signed)
Victor Oconnor is an 46 y.o. male.   Chief Complaint: low back pain HPI: The patient reports low back symptoms including low back pain which began 2 weeks ago without any known injury. Symptoms include pain. The pain radiates to the right buttock, right thigh and right lower leg. The patient describes the severity of their symptoms as moderate in severity. Prior to being seen today the patient was previously evaluated in the emergency room. Symptoms present at the patient's previous evaluation included back pain. Past evaluation has included x-ray of the lumbar spine. Past treatment has included corticosteroids. He reports that he did not recall an injury or change in activity that could have caused the pain. He says that the pain is continuing to get worse despite rest and Prednisone. He says that he has had flares of pain in his low back before but not this severe. He had an MRI in 2104 which showed a disc bulge at L4-L5, L5-S1 but no specific treatment at that time other than activity modification. No change in bladder or bowel function. He is having pain at all times. He is unable to sleep. He feels like the right leg is going to give out on him. He has numbness in the right lower leg. MRI showed a large herniated disc at L4-L5 on the right. He was scheduled for surgery in later December. Unfortunately, he was experiencing severe pain that required admission to the hospital today.  Past Medical History:  Diagnosis Date  . Anxiety   . Depression   . Hyperlipidemia   . Hypertension    no meds for hypertension  . Hypothyroidism   . Muscle twitch   . Pain    severe pain left shoulder   s/p left shoulder rotator cuff repair on 01/14/12  . Sleep apnea    prior to gastric bypass 2011- no cpap at present    Past Surgical History:  Procedure Laterality Date  . GASTRIC BYPASS    . HERNIA REPAIR     as a child  . SHOULDER OPEN ROTATOR CUFF REPAIR  01/14/2012   Procedure: ROTATOR CUFF REPAIR SHOULDER  OPEN;  Surgeon: Jacki Conesonald A Gioffre, MD;  Location: [facility];  Service: Orthopedics;  Laterality: Left;  with graft and anchors    Family History  Problem Relation Age of Onset  . Diabetes Father    Social History:  reports that he quit smoking about 10 years ago. His smoking use included cigarettes. He has a 8.00 pack-year smoking history. he has never used smokeless tobacco. He reports that he drinks alcohol. He reports that he does not use drugs.  Allergies:  Allergies  Allergen Reactions  . Penicillins Nausea And Vomiting      Current Outpatient Medications:  .  ALPRAZolam (XANAX) 1 MG tablet, Take 1 mg by mouth at bedtime as needed for anxiety. , Disp: , Rfl:  .  buPROPion (WELLBUTRIN SR) 150 MG 12 hr tablet, Take 150 mg by mouth every morning., Disp: , Rfl:  .  Calcium Carbonate (CALCIUM 500 PO), Take 1,000 mg by mouth daily., Disp: , Rfl:  .  escitalopram (LEXAPRO) 5 MG tablet, Take 5 mg by mouth every morning., Disp: , Rfl:  .  gabapentin (NEURONTIN) 600 MG tablet, Take 600 mg by mouth 4 (four) times daily., Disp: , Rfl:  .  levothyroxine (SYNTHROID, LEVOTHROID) 200 MCG tablet, Take 200 mcg by mouth daily before breakfast. , Disp:  .  lisinopril (PRINIVIL,ZESTRIL) 10 MG tablet, Take 10 mg  by mouth every morning., Disp: , Rfl:  .  Multiple Vitamin (MULTIVITAMIN WITH MINERALS) TABS, Take 2 tablets by mouth daily. , Disp: , Rfl:  .  traMADol (ULTRAM) 50 MG tablet, Take 50-100 mg by mouth every 6 (six) hours as needed for moderate pain.,    Review of Systems  Constitutional: Negative for chills, diaphoresis, fever, malaise/fatigue and weight loss.  HENT: Negative.   Eyes: Negative.   Respiratory: Negative.   Cardiovascular: Negative.   Gastrointestinal: Negative.   Genitourinary: Negative.   Musculoskeletal: Positive for back pain and myalgias. Negative for falls, joint pain and neck pain.  Skin: Negative.   Neurological: Positive for tingling, sensory change and weakness.  Negative for dizziness, tremors, speech change, focal weakness, seizures, loss of consciousness and headaches.  Endo/Heme/Allergies: Negative.   Psychiatric/Behavioral: Positive for depression. Negative for hallucinations, memory loss, substance abuse and suicidal ideas. The patient is nervous/anxious. The patient does not have insomnia.     Physical Exam  Constitutional: He is oriented to person, place, and time. He appears well-developed and well-nourished. No distress.  HENT:  Head: Normocephalic and atraumatic.  Right Ear: External ear normal.  Left Ear: External ear normal.  Nose: Nose normal.  Mouth/Throat: Oropharynx is clear and moist.  Eyes: Conjunctivae and EOM are normal.  Neck: Normal range of motion. Neck supple.  Cardiovascular: Normal rate, regular rhythm, normal heart sounds and intact distal pulses.  No murmur heard. Respiratory: Effort normal and breath sounds normal. No respiratory distress. He has no wheezes.  GI: Soft. Bowel sounds are normal. He exhibits no distension. There is no tenderness.  Musculoskeletal:  He has significant pain with motion of the lumbar spine, causing radicular pain into the right buttocks and posterior thigh and anterior lower leg. Calves soft and nontender. Normal painless motion of the hips and knees but positive SLR on the right side. No bony tenderness over the lumbar spine  Neurological: He is alert and oriented to person, place, and time. A sensory deficit is present.  Weakness of the EHL on the right foot. No other motor deficits noted. Decreased sensation to light touch along the L5 nerve distribution in the right lower leg.  Skin: No rash noted. He is not diaphoretic. No erythema.  Psychiatric: He has a normal mood and affect. His behavior is normal.    Vitals BP: 134/83 HR: 65 RR:18  Assessment/Plan Lumbar disc herniation L4-L5, right [name] has a large disc herniation at L4-L5 on the right. He needs hospital admission today  for pain management and we will proceed with hemilaminectomy and microdiscectomy L4-L5 right tomorrow. Quesitons and concerns addressed with the patient and he has elected to move forward with this plan.   Jahid Weida LAUREN, PA-C

## 2017-05-15 ENCOUNTER — Encounter (HOSPITAL_COMMUNITY): Payer: Self-pay | Admitting: Certified Registered"

## 2017-05-15 ENCOUNTER — Inpatient Hospital Stay (HOSPITAL_COMMUNITY): Payer: PPO

## 2017-05-15 ENCOUNTER — Inpatient Hospital Stay (HOSPITAL_COMMUNITY): Payer: PPO | Admitting: Certified Registered"

## 2017-05-15 ENCOUNTER — Encounter (HOSPITAL_COMMUNITY)
Admission: AD | Disposition: A | Discharge status: Home / Self Care | Payer: Self-pay | Source: Ambulatory Visit | Attending: Orthopedic Surgery

## 2017-05-15 ENCOUNTER — Inpatient Hospital Stay: Admit: 2017-05-15 | Payer: Self-pay | Admitting: Orthopedic Surgery

## 2017-05-15 DIAGNOSIS — M549 Dorsalgia, unspecified: Secondary | ICD-10-CM | POA: Diagnosis not present

## 2017-05-15 DIAGNOSIS — F419 Anxiety disorder, unspecified: Secondary | ICD-10-CM | POA: Diagnosis not present

## 2017-05-15 DIAGNOSIS — M7138 Other bursal cyst, other site: Secondary | ICD-10-CM | POA: Diagnosis not present

## 2017-05-15 DIAGNOSIS — I10 Essential (primary) hypertension: Secondary | ICD-10-CM | POA: Diagnosis not present

## 2017-05-15 DIAGNOSIS — Z79890 Hormone replacement therapy (postmenopausal): Secondary | ICD-10-CM | POA: Diagnosis not present

## 2017-05-15 DIAGNOSIS — E039 Hypothyroidism, unspecified: Secondary | ICD-10-CM | POA: Diagnosis not present

## 2017-05-15 DIAGNOSIS — Z79899 Other long term (current) drug therapy: Secondary | ICD-10-CM | POA: Diagnosis not present

## 2017-05-15 DIAGNOSIS — F329 Major depressive disorder, single episode, unspecified: Secondary | ICD-10-CM | POA: Diagnosis not present

## 2017-05-15 DIAGNOSIS — E785 Hyperlipidemia, unspecified: Secondary | ICD-10-CM | POA: Diagnosis not present

## 2017-05-15 DIAGNOSIS — M48062 Spinal stenosis, lumbar region with neurogenic claudication: Secondary | ICD-10-CM | POA: Diagnosis present

## 2017-05-15 DIAGNOSIS — Z880 Allergy status to penicillin: Secondary | ICD-10-CM | POA: Diagnosis not present

## 2017-05-15 DIAGNOSIS — M5126 Other intervertebral disc displacement, lumbar region: Secondary | ICD-10-CM | POA: Diagnosis not present

## 2017-05-15 DIAGNOSIS — M4696 Unspecified inflammatory spondylopathy, lumbar region: Secondary | ICD-10-CM | POA: Diagnosis not present

## 2017-05-15 DIAGNOSIS — G4730 Sleep apnea, unspecified: Secondary | ICD-10-CM | POA: Diagnosis not present

## 2017-05-15 DIAGNOSIS — M47816 Spondylosis without myelopathy or radiculopathy, lumbar region: Secondary | ICD-10-CM | POA: Diagnosis not present

## 2017-05-15 DIAGNOSIS — M21371 Foot drop, right foot: Secondary | ICD-10-CM | POA: Diagnosis not present

## 2017-05-15 DIAGNOSIS — M5116 Intervertebral disc disorders with radiculopathy, lumbar region: Secondary | ICD-10-CM | POA: Diagnosis not present

## 2017-05-15 DIAGNOSIS — M5136 Other intervertebral disc degeneration, lumbar region: Secondary | ICD-10-CM | POA: Diagnosis not present

## 2017-05-15 HISTORY — PX: HEMI-MICRODISCECTOMY LUMBAR LAMINECTOMY LEVEL 1: SHX5846

## 2017-05-15 LAB — URINALYSIS, ROUTINE W REFLEX MICROSCOPIC
Bilirubin Urine: NEGATIVE
Glucose, UA: NEGATIVE mg/dL
Hgb urine dipstick: NEGATIVE
Ketones, ur: NEGATIVE mg/dL
Leukocytes, UA: NEGATIVE
Nitrite: NEGATIVE
Protein, ur: NEGATIVE mg/dL
Specific Gravity, Urine: 1.005 (ref 1.005–1.030)
pH: 7 (ref 5.0–8.0)

## 2017-05-15 SURGERY — LUMBAR LAMINECTOMY/DECOMPRESSION MICRODISCECTOMY 1 LEVEL
Anesthesia: General

## 2017-05-15 SURGERY — HEMI-MICRODISCECTOMY LUMBAR LAMINECTOMY LEVEL 1
Anesthesia: General

## 2017-05-15 MED ORDER — BACITRACIN-NEOMYCIN-POLYMYXIN 400-5-5000 EX OINT
TOPICAL_OINTMENT | CUTANEOUS | Status: DC | PRN
  Administered 2017-05-15: 1 via TOPICAL

## 2017-05-15 MED ORDER — DULOXETINE HCL 60 MG PO CPEP
60.0000 mg | ORAL_CAPSULE | Freq: Two times a day (BID) | ORAL | Status: DC
  Administered 2017-05-15 – 2017-05-16 (×2): 60 mg via ORAL
  Filled 2017-05-15 (×2): qty 1

## 2017-05-15 MED ORDER — KETAMINE HCL 10 MG/ML IJ SOLN
INTRAMUSCULAR | Status: AC
  Filled 2017-05-15: qty 1

## 2017-05-15 MED ORDER — BISACODYL 5 MG PO TBEC: 5.0000 mg | DELAYED_RELEASE_TABLET | Freq: Every day | ORAL | Status: DC | PRN

## 2017-05-15 MED ORDER — ROCURONIUM BROMIDE 10 MG/ML (PF) SYRINGE
PREFILLED_SYRINGE | INTRAVENOUS | Status: DC | PRN
  Administered 2017-05-15: 30 mg via INTRAVENOUS
  Administered 2017-05-15: 10 mg via INTRAVENOUS

## 2017-05-15 MED ORDER — SODIUM CHLORIDE 0.9 % IV SOLN
INTRAVENOUS | Status: AC
  Filled 2017-05-15: qty 500000

## 2017-05-15 MED ORDER — FENTANYL CITRATE (PF) 100 MCG/2ML IJ SOLN: 25.0000 ug | INTRAMUSCULAR | Status: DC | PRN

## 2017-05-15 MED ORDER — FENTANYL CITRATE (PF) 100 MCG/2ML IJ SOLN
INTRAMUSCULAR | Status: AC
  Filled 2017-05-15: qty 2

## 2017-05-15 MED ORDER — FENTANYL CITRATE (PF) 250 MCG/5ML IJ SOLN
INTRAMUSCULAR | Status: DC | PRN
  Administered 2017-05-15: 100 ug via INTRAVENOUS
  Administered 2017-05-15: 50 ug via INTRAVENOUS
  Administered 2017-05-15: 100 ug via INTRAVENOUS

## 2017-05-15 MED ORDER — GABAPENTIN 300 MG PO CAPS
300.0000 mg | ORAL_CAPSULE | Freq: Three times a day (TID) | ORAL | Status: DC
  Administered 2017-05-15 – 2017-05-16 (×3): 300 mg via ORAL
  Filled 2017-05-15 (×3): qty 1

## 2017-05-15 MED ORDER — THROMBIN (RECOMBINANT) 5000 UNITS EX SOLR
CUTANEOUS | Status: DC | PRN
  Administered 2017-05-15: 5 mL via TOPICAL

## 2017-05-15 MED ORDER — HYDROMORPHONE HCL 1 MG/ML IJ SOLN
0.2500 mg | INTRAMUSCULAR | Status: DC | PRN
  Administered 2017-05-15: 0.5 mg via INTRAVENOUS
  Administered 2017-05-15 (×2): 0.25 mg via INTRAVENOUS
  Administered 2017-05-15: 0.5 mg via INTRAVENOUS

## 2017-05-15 MED ORDER — FENTANYL CITRATE (PF) 250 MCG/5ML IJ SOLN
INTRAMUSCULAR | Status: AC
  Filled 2017-05-15: qty 5

## 2017-05-15 MED ORDER — LEVOTHYROXINE SODIUM 100 MCG PO TABS
200.0000 ug | ORAL_TABLET | Freq: Every day | ORAL | Status: DC
  Administered 2017-05-16: 200 ug via ORAL
  Filled 2017-05-15: qty 2

## 2017-05-15 MED ORDER — SODIUM CHLORIDE 0.9 % IV SOLN
INTRAVENOUS | Status: DC | PRN
  Administered 2017-05-15: 500 mL

## 2017-05-15 MED ORDER — METOPROLOL TARTRATE 25 MG PO TABS
25.0000 mg | ORAL_TABLET | Freq: Every day | ORAL | Status: DC
  Administered 2017-05-15 – 2017-05-16 (×2): 25 mg via ORAL
  Filled 2017-05-15 (×2): qty 1

## 2017-05-15 MED ORDER — BUPIVACAINE-EPINEPHRINE (PF) 0.5% -1:200000 IJ SOLN
INTRAMUSCULAR | Status: AC
  Filled 2017-05-15: qty 30

## 2017-05-15 MED ORDER — PROPOFOL 10 MG/ML IV BOLUS
INTRAVENOUS | Status: AC
  Filled 2017-05-15: qty 20

## 2017-05-15 MED ORDER — METHOCARBAMOL 1000 MG/10ML IJ SOLN
500.0000 mg | Freq: Four times a day (QID) | INTRAVENOUS | Status: DC | PRN
  Filled 2017-05-15: qty 5

## 2017-05-15 MED ORDER — VANCOMYCIN HCL 10 G IV SOLR
1500.0000 mg | Freq: Once | INTRAVENOUS | Status: AC
  Administered 2017-05-16: 01:00:00 1500 mg via INTRAVENOUS
  Filled 2017-05-15: qty 1500

## 2017-05-15 MED ORDER — BACITRACIN-NEOMYCIN-POLYMYXIN 400-5-5000 EX OINT
TOPICAL_OINTMENT | CUTANEOUS | Status: AC
  Filled 2017-05-15: qty 1

## 2017-05-15 MED ORDER — MIDAZOLAM HCL 2 MG/2ML IJ SOLN
INTRAMUSCULAR | Status: AC
  Filled 2017-05-15: qty 2

## 2017-05-15 MED ORDER — VANCOMYCIN HCL IN DEXTROSE 1-5 GM/200ML-% IV SOLN
INTRAVENOUS | Status: AC
  Administered 2017-05-15: 1000 mg via INTRAVENOUS
  Filled 2017-05-15: qty 200

## 2017-05-15 MED ORDER — DIPHENHYDRAMINE HCL 25 MG PO CAPS
25.0000 mg | ORAL_CAPSULE | Freq: Four times a day (QID) | ORAL | Status: DC | PRN
  Administered 2017-05-15: 25 mg via ORAL
  Filled 2017-05-15: qty 1

## 2017-05-15 MED ORDER — METHOCARBAMOL 500 MG PO TABS: 500.0000 mg | ORAL_TABLET | Freq: Four times a day (QID) | ORAL | 0 refills | Status: DC | PRN

## 2017-05-15 MED ORDER — HYDROMORPHONE HCL 2 MG PO TABS
4.0000 mg | ORAL_TABLET | ORAL | Status: DC | PRN
  Administered 2017-05-16: 02:00:00 4 mg via ORAL
  Filled 2017-05-15: qty 2

## 2017-05-15 MED ORDER — SUCCINYLCHOLINE CHLORIDE 200 MG/10ML IV SOSY
PREFILLED_SYRINGE | INTRAVENOUS | Status: DC | PRN
  Administered 2017-05-15: 100 mg via INTRAVENOUS

## 2017-05-15 MED ORDER — MENTHOL 3 MG MT LOZG: 1.0000 | LOZENGE | OROMUCOSAL | Status: DC | PRN

## 2017-05-15 MED ORDER — METHOCARBAMOL 500 MG PO TABS
500.0000 mg | ORAL_TABLET | Freq: Four times a day (QID) | ORAL | Status: DC | PRN
  Administered 2017-05-15 – 2017-05-16 (×2): 500 mg via ORAL
  Filled 2017-05-15 (×2): qty 1

## 2017-05-15 MED ORDER — ESCITALOPRAM OXALATE 10 MG PO TABS
5.0000 mg | ORAL_TABLET | Freq: Every morning | ORAL | Status: DC
  Administered 2017-05-16: 5 mg via ORAL
  Filled 2017-05-15: qty 1

## 2017-05-15 MED ORDER — ONDANSETRON HCL 4 MG/2ML IJ SOLN
INTRAMUSCULAR | Status: DC | PRN
  Administered 2017-05-15: 4 mg via INTRAVENOUS

## 2017-05-15 MED ORDER — ONDANSETRON HCL 4 MG/2ML IJ SOLN: 4.0000 mg | Freq: Four times a day (QID) | INTRAMUSCULAR | Status: DC | PRN

## 2017-05-15 MED ORDER — ONDANSETRON HCL 4 MG PO TABS: 4.0000 mg | ORAL_TABLET | Freq: Four times a day (QID) | ORAL | Status: DC | PRN

## 2017-05-15 MED ORDER — BUPROPION HCL ER (SR) 150 MG PO TB12
150.0000 mg | ORAL_TABLET | Freq: Every morning | ORAL | Status: DC
  Administered 2017-05-16: 11:00:00 150 mg via ORAL
  Filled 2017-05-15: qty 1

## 2017-05-15 MED ORDER — ACETAMINOPHEN 650 MG RE SUPP: 650.0000 mg | RECTAL | Status: DC | PRN

## 2017-05-15 MED ORDER — FLEET ENEMA 7-19 GM/118ML RE ENEM: 1.0000 | ENEMA | Freq: Once | RECTAL | Status: DC | PRN

## 2017-05-15 MED ORDER — PHENOL 1.4 % MT LIQD
1.0000 | OROMUCOSAL | Status: DC | PRN
  Filled 2017-05-15: qty 177

## 2017-05-15 MED ORDER — POLYETHYLENE GLYCOL 3350 17 G PO PACK: 17.0000 g | PACK | Freq: Every day | ORAL | Status: DC | PRN

## 2017-05-15 MED ORDER — VANCOMYCIN HCL 1000 MG IV SOLR
INTRAVENOUS | Status: DC | PRN
  Administered 2017-05-15: 1000 mg via INTRAVENOUS

## 2017-05-15 MED ORDER — MEPERIDINE HCL 50 MG/ML IJ SOLN: 6.2500 mg | INTRAMUSCULAR | Status: DC | PRN

## 2017-05-15 MED ORDER — DEXAMETHASONE SODIUM PHOSPHATE 10 MG/ML IJ SOLN
INTRAMUSCULAR | Status: DC | PRN
  Administered 2017-05-15: 10 mg via INTRAVENOUS

## 2017-05-15 MED ORDER — LACTATED RINGERS IV SOLN
INTRAVENOUS | Status: DC
  Administered 2017-05-15: 18:00:00 via INTRAVENOUS

## 2017-05-15 MED ORDER — OXYCODONE-ACETAMINOPHEN 10-325 MG PO TABS: 1.0000 | ORAL_TABLET | ORAL | 0 refills | Status: DC | PRN

## 2017-05-15 MED ORDER — SUGAMMADEX SODIUM 500 MG/5ML IV SOLN
INTRAVENOUS | Status: DC | PRN
  Administered 2017-05-15: 250 mg via INTRAVENOUS

## 2017-05-15 MED ORDER — LISINOPRIL 10 MG PO TABS
10.0000 mg | ORAL_TABLET | Freq: Every morning | ORAL | Status: DC
  Administered 2017-05-16: 10 mg via ORAL
  Filled 2017-05-15: qty 1

## 2017-05-15 MED ORDER — BUPIVACAINE LIPOSOME 1.3 % IJ SUSP
20.0000 mL | Freq: Once | INTRAMUSCULAR | Status: AC
  Administered 2017-05-15: 20 mL
  Filled 2017-05-15: qty 20

## 2017-05-15 MED ORDER — HYDROCODONE-ACETAMINOPHEN 5-325 MG PO TABS: 1.0000 | ORAL_TABLET | Freq: Four times a day (QID) | ORAL | Status: DC | PRN

## 2017-05-15 MED ORDER — HYDROMORPHONE HCL 1 MG/ML IJ SOLN
INTRAMUSCULAR | Status: AC
  Filled 2017-05-15: qty 2

## 2017-05-15 MED ORDER — KETAMINE HCL 10 MG/ML IJ SOLN
INTRAMUSCULAR | Status: DC | PRN
  Administered 2017-05-15: 10 mg via INTRAVENOUS
  Administered 2017-05-15: 30 mg via INTRAVENOUS

## 2017-05-15 MED ORDER — FENTANYL CITRATE (PF) 100 MCG/2ML IJ SOLN
INTRAMUSCULAR | Status: DC | PRN
  Administered 2017-05-15 (×2): 50 ug via INTRAVENOUS

## 2017-05-15 MED ORDER — LIDOCAINE 2% (20 MG/ML) 5 ML SYRINGE
INTRAMUSCULAR | Status: DC | PRN
  Administered 2017-05-15: 100 mg via INTRAVENOUS

## 2017-05-15 MED ORDER — ACETAMINOPHEN 325 MG PO TABS: 650.0000 mg | ORAL_TABLET | ORAL | Status: DC | PRN

## 2017-05-15 MED ORDER — ONDANSETRON HCL 4 MG/2ML IJ SOLN: 4.0000 mg | Freq: Once | INTRAMUSCULAR | Status: DC | PRN

## 2017-05-15 MED ORDER — INDAPAMIDE 1.25 MG PO TABS
1.2500 mg | ORAL_TABLET | Freq: Every day | ORAL | Status: DC
  Administered 2017-05-16: 11:00:00 1.25 mg via ORAL
  Filled 2017-05-15 (×2): qty 1

## 2017-05-15 MED ORDER — BUPIVACAINE-EPINEPHRINE (PF) 0.5% -1:200000 IJ SOLN
INTRAMUSCULAR | Status: DC | PRN
  Administered 2017-05-15: 20 mL

## 2017-05-15 MED ORDER — LACTATED RINGERS IV SOLN
INTRAVENOUS | Status: DC | PRN
  Administered 2017-05-15 (×2): via INTRAVENOUS

## 2017-05-15 MED ORDER — HYDROMORPHONE HCL 1 MG/ML IJ SOLN
0.5000 mg | INTRAMUSCULAR | Status: DC | PRN
  Administered 2017-05-16: 04:00:00 0.5 mg via INTRAVENOUS
  Filled 2017-05-15: qty 0.5

## 2017-05-15 MED ORDER — THROMBIN (RECOMBINANT) 5000 UNITS EX SOLR
CUTANEOUS | Status: AC
  Filled 2017-05-15: qty 5000

## 2017-05-15 MED ORDER — MIDAZOLAM HCL 2 MG/2ML IJ SOLN
INTRAMUSCULAR | Status: DC | PRN
  Administered 2017-05-15: 2 mg via INTRAVENOUS

## 2017-05-15 MED ORDER — PROPOFOL 10 MG/ML IV BOLUS
INTRAVENOUS | Status: DC | PRN
  Administered 2017-05-15: 200 mg via INTRAVENOUS

## 2017-05-15 SURGICAL SUPPLY — 42 items
BAG ZIPLOCK 12X15 (MISCELLANEOUS) IMPLANT
CLEANER TIP ELECTROSURG 2X2 (MISCELLANEOUS) ×3 IMPLANT
COVER SURGICAL LIGHT HANDLE (MISCELLANEOUS) ×3 IMPLANT
DRAIN PENROSE 18X1/4 LTX STRL (WOUND CARE) IMPLANT
DRAPE MICROSCOPE LEICA (MISCELLANEOUS) ×3 IMPLANT
DRAPE SHEET LG 3/4 BI-LAMINATE (DRAPES) ×3 IMPLANT
DRAPE SURG 17X11 SM STRL (DRAPES) ×3 IMPLANT
DRSG ADAPTIC 3X8 NADH LF (GAUZE/BANDAGES/DRESSINGS) ×3 IMPLANT
DRSG PAD ABDOMINAL 8X10 ST (GAUZE/BANDAGES/DRESSINGS) IMPLANT
DURAPREP 26ML APPLICATOR (WOUND CARE) ×3 IMPLANT
ELECT BLADE TIP CTD 4 INCH (ELECTRODE) ×3 IMPLANT
ELECT REM PT RETURN 15FT ADLT (MISCELLANEOUS) ×3 IMPLANT
GAUZE SPONGE 4X4 12PLY STRL (GAUZE/BANDAGES/DRESSINGS) ×3 IMPLANT
GLOVE BIOGEL PI IND STRL 6.5 (GLOVE) ×1 IMPLANT
GLOVE BIOGEL PI IND STRL 8.5 (GLOVE) ×1 IMPLANT
GLOVE BIOGEL PI INDICATOR 6.5 (GLOVE) ×2
GLOVE BIOGEL PI INDICATOR 8.5 (GLOVE) ×2
GLOVE ECLIPSE 8.0 STRL XLNG CF (GLOVE) ×3 IMPLANT
GLOVE SURG SS PI 6.5 STRL IVOR (GLOVE) ×3 IMPLANT
GOWN STRL REUS W/ TWL LRG LVL3 (GOWN DISPOSABLE) ×1 IMPLANT
GOWN STRL REUS W/TWL LRG LVL3 (GOWN DISPOSABLE) ×2
GOWN STRL REUS W/TWL XL LVL3 (GOWN DISPOSABLE) ×6 IMPLANT
KIT BASIN OR (CUSTOM PROCEDURE TRAY) ×3 IMPLANT
KIT POSITIONING SURG ANDREWS (MISCELLANEOUS) ×3 IMPLANT
MANIFOLD NEPTUNE II (INSTRUMENTS) ×3 IMPLANT
NEEDLE SPNL 18GX3.5 QUINCKE PK (NEEDLE) ×6 IMPLANT
PACK LAMINECTOMY ORTHO (CUSTOM PROCEDURE TRAY) ×3 IMPLANT
PAD ABD 8X10 STRL (GAUZE/BANDAGES/DRESSINGS) ×9 IMPLANT
PATTIES SURGICAL .5 X.5 (GAUZE/BANDAGES/DRESSINGS) ×3 IMPLANT
PATTIES SURGICAL .75X.75 (GAUZE/BANDAGES/DRESSINGS) ×3 IMPLANT
PATTIES SURGICAL 1X1 (DISPOSABLE) ×3 IMPLANT
PIN SAFETY NICK PLATE  2 MED (MISCELLANEOUS) ×2
PIN SAFETY NICK PLATE 2 MED (MISCELLANEOUS) ×1 IMPLANT
SPONGE SURGIFOAM ABS GEL 100 (HEMOSTASIS) ×6 IMPLANT
STAPLER VISISTAT 35W (STAPLE) ×3 IMPLANT
SUT VIC AB 1 CT1 27 (SUTURE) ×4
SUT VIC AB 1 CT1 27XBRD ANTBC (SUTURE) ×2 IMPLANT
SUT VIC AB 2-0 CT1 27 (SUTURE) ×4
SUT VIC AB 2-0 CT1 TAPERPNT 27 (SUTURE) ×2 IMPLANT
SYR 20CC LL (SYRINGE) ×6 IMPLANT
TOWEL OR 17X26 10 PK STRL BLUE (TOWEL DISPOSABLE) ×3 IMPLANT
TOWEL OR NON WOVEN STRL DISP B (DISPOSABLE) ×3 IMPLANT

## 2017-05-15 NOTE — Interval H&P Note (Signed)
History and Physical Interval Note:  05/15/2017 1:20 PM  Victor RangerLawrence Oconnor  has presented today for surgery, with the diagnosis of Lumbar hernitated nucelus pulposus  The various methods of treatment have been discussed with the patient and family. After consideration of risks, benefits and other options for treatment, the patient has consented to  Procedure(s): Hemilaminectomy lumbar microdisectomy L4-L5 right, decompression (N/A) as a surgical intervention .  The patient's history has been reviewed, patient examined, no change in status, stable for surgery.  I have reviewed the patient's chart and labs.  Questions were answered to the patient's satisfaction.     Ranee Gosselinonald Peg Fifer

## 2017-05-15 NOTE — Discharge Instructions (Signed)
For the first three days, remove your dressing, and tape a piece of saran wrap over your incision, Take your shower, then remove the saran wrap and put a clean dressing on After three days you can shower without the saran wrap.  No lifting or excessive bending No driving while taking pain medications Call Dr. Darrelyn HillockGioffre if any wound complications or temperature of 101 degrees F or over.  Call the office for an appointment to see Dr. Darrelyn HillockGioffre in two weeks: [phone] and ask for Dr. Jeannetta EllisGioffre's nurse, Mackey Birchwoodammy Johnson.

## 2017-05-15 NOTE — Anesthesia Procedure Notes (Signed)
Date/Time: 05/15/2017 3:58 PM Performed by: Minerva EndsMirarchi, Lillionna Nabi M, CRNA Pre-anesthesia Checklist: Suction available, Patient being monitored and Emergency Drugs available Oxygen Delivery Method: Simple face mask Placement Confirmation: positive ETCO2 and breath sounds checked- equal and bilateral Dental Injury: Teeth and Oropharynx as per pre-operative assessment  Comments: Extubated to face mask

## 2017-05-15 NOTE — Progress Notes (Signed)
Pharmacy Antibiotic Note  Victor Oconnor is a 46 y.o. male admitted on 05/14/2017 for Hemilaminectomy lumbar microdisectomy L4-L5 right, decompression.  Pharmacy has been consulted for Vancomycin postop surgical prophylaxis dosing.  Weight > 100 kg SCr 0.8, CrCl > 100 ml/min No drains placed  Plan: Vancomycin 1500mg  IV x1 dose.  Height: 6\' 1"  (185.4 cm) Weight: 252 lb (114.3 kg) IBW/kg (Calculated) : 79.9  Temp (24hrs), Avg:98.3 F (36.8 C), Min:97.6 F (36.4 C), Max:99.1 F (37.3 C)  Recent Labs  Lab 05/14/17 1710  WBC 12.1*  CREATININE 0.80    Estimated Creatinine Clearance: 151.3 mL/min (by C-G formula based on SCr of 0.8 mg/dL).    Allergies  Allergen Reactions  . Penicillins Nausea And Vomiting    Has patient had a PCN reaction causing immediate rash, facial/tongue/throat swelling, SOB or lightheadedness with hypotension: No Has patient had a PCN reaction causing severe rash involving mucus membranes or skin necrosis: No Has patient had a PCN reaction that required hospitalization:No Has patient had a PCN reaction occurring within the last 10 years: No If all of the above answers are "NO", then may proceed with Cephalosporin use.     Thank you for allowing pharmacy to be a part of this patient's care.  Lynann Beaverhristine Alyric Parkin PharmD, BCPS Pager [phone] 05/15/2017 6:20 PM

## 2017-05-15 NOTE — Transfer of Care (Signed)
Immediate Anesthesia Transfer of Care Note  Patient: Victor RangerLawrence Oconnor  Procedure(s) Performed: Hemilaminectomy lumbar microdisectomy L4-L5 right, decompression (N/A )  Patient Location: PACU  Anesthesia Type:General  Level of Consciousness: awake and alert   Airway & Oxygen Therapy: Patient Spontanous Breathing and Patient connected to face mask oxygen  Post-op Assessment: Report given to RN and Post -op Vital signs reviewed and stable  Post vital signs: Reviewed and stable  Last Vitals:  Vitals:   05/14/17 2105 05/15/17 0620  BP: 113/65 134/83  Pulse: (!) 59 65  Resp: 20 18  Temp: 37.3 C 36.8 C  SpO2: 96% 98%    Last Pain:  Vitals:   05/15/17 1110  TempSrc:   PainSc: 6       Patients Stated Pain Goal: 2 (05/15/17 0200)  Complications: No apparent anesthesia complications

## 2017-05-15 NOTE — Anesthesia Preprocedure Evaluation (Addendum)
Anesthesia Evaluation  Patient identified by MRN, date of birth, ID band Patient awake    Reviewed: Allergy & Precautions, H&P , NPO status , Patient's Chart, lab work & pertinent test results  Airway Mallampati: II  TM Distance: <3 FB Neck ROM: Full    Dental no notable dental hx. (+) Dental Advisory Given   Pulmonary neg pulmonary ROS, former smoker,    Pulmonary exam normal breath sounds clear to auscultation       Cardiovascular hypertension, Pt. on medications and Pt. on home beta blockers Normal cardiovascular exam Rhythm:Regular Rate:Normal     Neuro/Psych negative neurological ROS  negative psych ROS   GI/Hepatic negative GI ROS, Neg liver ROS,   Endo/Other  Hypothyroidism Morbid obesity  Renal/GU negative Renal ROS  negative genitourinary   Musculoskeletal negative musculoskeletal ROS (+)   Abdominal   Peds negative pediatric ROS (+)  Hematology negative hematology ROS (+)   Anesthesia Other Findings   Reproductive/Obstetrics negative OB ROS                            Anesthesia Physical  Anesthesia Plan  ASA: III  Anesthesia Plan: General   Post-op Pain Management:    Induction: Intravenous  PONV Risk Score and Plan: 1 and Ondansetron and Treatment may vary due to age or medical condition  Airway Management Planned: Oral ETT  Additional Equipment:   Intra-op Plan:   Post-operative Plan: Extubation in OR  Informed Consent: I have reviewed the patients History and Physical, chart, labs and discussed the procedure including the risks, benefits and alternatives for the proposed anesthesia with the patient or authorized representative who has indicated his/her understanding and acceptance.   Dental advisory given  Plan Discussed with: CRNA and Surgeon  Anesthesia Plan Comments:         Anesthesia Quick Evaluation

## 2017-05-15 NOTE — Brief Op Note (Signed)
05/14/2017 - 05/15/2017  3:49 PM  PATIENT:  Thad RangerLawrence Manfre  46 y.o. male  PRE-OPERATIVE DIAGNOSIS:  Lumbar hernitated nucelus pulposus at L-4-L-5on the Right.Spinal Stenosis at L-4-L-5 and Synovial Cyst at L-4-L-5 on the right and Partial Footdrop on the right.  POST-OPERATIVE DIAGNOSIS: Same as Pre-Op  PROCEDURE: Complete Decompressive Lumbar Laminectomy at L-4-L-5 for Spinal Stenosis.Facetectomy on the Right ,Inferior,for arthritis and a Synovial Cyst and Lateral recess Stenosis. Microdiscectomy at L-4-L-5 on the Right.  SURGEON:  Surgeon(s) and Role:    * Ranee GosselinGioffre, Ghadeer Kastelic, MD - Primary  PHYSICIAN ASSISTANT: Dimitri PedAmber Constable PA  ASSISTANTS:Amber Selzonstable PA  ANESTHESIA:   general  EBL:  50 mL   BLOOD ADMINISTERED:none  DRAINS: none   LOCAL MEDICATIONS USED:  MARCAINE 20cc of 0.50% with Epinephrine at the start of the case and 20cc of Exparel at the end of the case.    SPECIMEN:  Source of Specimen:  L-4-L-5  DISPOSITION OF SPECIMEN:  PATHOLOGY  COUNTS:  YES  TOURNIQUET:  * No tourniquets in log *  DICTATION: .Other Dictation: Dictation Number [id number]  PLAN OF CARE: Admit for overnight observation  PATIENT DISPOSITION:  PACU - hemodynamically stable.   Delay start of Pharmacological VTE agent (>24hrs) due to surgical blood loss or risk of bleeding: yes

## 2017-05-15 NOTE — Anesthesia Procedure Notes (Signed)
Procedure Name: Intubation Date/Time: 05/15/2017 1:38 PM Performed by: Minerva EndsMirarchi, Tatyanna Cronk M, CRNA Pre-anesthesia Checklist: Patient identified, Emergency Drugs available, Suction available and Patient being monitored Patient Re-evaluated:Patient Re-evaluated prior to induction Oxygen Delivery Method: Circle System Utilized Preoxygenation: Pre-oxygenation with 100% oxygen Induction Type: IV induction Ventilation: Mask ventilation without difficulty Laryngoscope Size: Miller and 2 Grade View: Grade I Tube type: Oral Tube size: 7.5 mm Number of attempts: 1 Airway Equipment and Method: Stylet Placement Confirmation: ETT inserted through vocal cords under direct vision,  positive ETCO2 and breath sounds checked- equal and bilateral Secured at: 22 cm Tube secured with: Tape Dental Injury: Teeth and Oropharynx as per pre-operative assessment  Comments: Smooth IV induction Oddono--- intubation AM CRNA atraumatic-- teeth and mouth as preop--- bilat BS Oddono

## 2017-05-16 DIAGNOSIS — F329 Major depressive disorder, single episode, unspecified: Secondary | ICD-10-CM | POA: Diagnosis not present

## 2017-05-16 DIAGNOSIS — Z6833 Body mass index (BMI) 33.0-33.9, adult: Secondary | ICD-10-CM | POA: Diagnosis not present

## 2017-05-16 DIAGNOSIS — F419 Anxiety disorder, unspecified: Secondary | ICD-10-CM | POA: Diagnosis not present

## 2017-05-16 DIAGNOSIS — Z9889 Other specified postprocedural states: Secondary | ICD-10-CM | POA: Diagnosis not present

## 2017-05-16 DIAGNOSIS — Z87891 Personal history of nicotine dependence: Secondary | ICD-10-CM | POA: Diagnosis not present

## 2017-05-16 DIAGNOSIS — E039 Hypothyroidism, unspecified: Secondary | ICD-10-CM | POA: Diagnosis not present

## 2017-05-16 DIAGNOSIS — E785 Hyperlipidemia, unspecified: Secondary | ICD-10-CM | POA: Diagnosis not present

## 2017-05-16 DIAGNOSIS — Z79890 Hormone replacement therapy (postmenopausal): Secondary | ICD-10-CM | POA: Diagnosis not present

## 2017-05-16 DIAGNOSIS — Z9884 Bariatric surgery status: Secondary | ICD-10-CM | POA: Diagnosis not present

## 2017-05-16 DIAGNOSIS — Z79899 Other long term (current) drug therapy: Secondary | ICD-10-CM | POA: Diagnosis not present

## 2017-05-16 DIAGNOSIS — Z880 Allergy status to penicillin: Secondary | ICD-10-CM | POA: Diagnosis not present

## 2017-05-16 DIAGNOSIS — M48062 Spinal stenosis, lumbar region with neurogenic claudication: Secondary | ICD-10-CM | POA: Diagnosis not present

## 2017-05-16 DIAGNOSIS — G4730 Sleep apnea, unspecified: Secondary | ICD-10-CM | POA: Diagnosis not present

## 2017-05-16 DIAGNOSIS — M21371 Foot drop, right foot: Secondary | ICD-10-CM | POA: Diagnosis not present

## 2017-05-16 DIAGNOSIS — Z833 Family history of diabetes mellitus: Secondary | ICD-10-CM | POA: Diagnosis not present

## 2017-05-16 DIAGNOSIS — I10 Essential (primary) hypertension: Secondary | ICD-10-CM | POA: Diagnosis not present

## 2017-05-16 DIAGNOSIS — M7138 Other bursal cyst, other site: Secondary | ICD-10-CM | POA: Diagnosis not present

## 2017-05-16 DIAGNOSIS — M5116 Intervertebral disc disorders with radiculopathy, lumbar region: Secondary | ICD-10-CM | POA: Diagnosis not present

## 2017-05-16 NOTE — Evaluation (Signed)
Physical Therapy Evaluation Patient Details Name: Victor Oconnor MRN: [id number] DOB: Jun 17, 1970 Today's Date: 05/16/2017   History of Present Illness  Pt presents for hemilaminectomy lumbar microdisectomy L4-L5 right, decompression. Had R sided pain that radiated into buttocks as well as foot drop. Was scheduled to have surgery later in December but came to ED with worsening of pain.   Clinical Impression  Patient evaluated by Physical Therapy with no further acute PT needs identified. All education has been completed and the patient has no further questions. Reviewed precautions and proper posture as well as activity level at d/c. Pt is mod I with activity and ready to go home later this AM.  See below for any follow-up Physical Therapy or equipment needs. PT is signing off. Thank you for this referral.     Follow Up Recommendations No PT follow up    Equipment Recommendations  None recommended by PT    Recommendations for Other Services       Precautions / Restrictions Precautions Precautions: Back Precaution Booklet Issued: Yes (comment) Precaution Comments: reviewed precautions and proper posture Restrictions Weight Bearing Restrictions: No      Mobility  Bed Mobility Overal bed mobility: Modified Independent             General bed mobility comments: vc's for not twisting, pt able to demo proper form  Transfers Overall transfer level: Modified independent Equipment used: None                Ambulation/Gait Ambulation/Gait assistance: Modified independent (Device/Increase time) Ambulation Distance (Feet): 300 Feet Assistive device: None Gait Pattern/deviations: Step-through pattern;Antalgic Gait velocity: decreased Gait velocity interpretation: Below normal speed for age/gender General Gait Details: guarded gait but good step length, no foot drop. Able to perform heel raise as well as lift toes.   Stairs            Wheelchair Mobility     Modified Rankin (Stroke Patients Only)       Balance Overall balance assessment: No apparent balance deficits (not formally assessed)                                           Pertinent Vitals/Pain Pain Assessment: 0-10 Pain Score: 6  Pain Location: back Pain Descriptors / Indicators: Aching Pain Intervention(s): Limited activity within patient's tolerance;Monitored during session;Premedicated before session    Home Living Family/patient expects to be discharged to:: Private residence Living Arrangements: Spouse/significant other Available Help at Discharge: Family;Available PRN/intermittently Type of Home: House Home Access: Stairs to enter   Entergy CorporationEntrance Stairs-Number of Steps: 3 Home Layout: One level Home Equipment: Cane - single point Additional Comments: has many big dogs, talked about them being put away while he enters the house    Prior Function Level of Independence: Independent         Comments: works part time      Higher education careers adviserHand Dominance        Extremity/Trunk Assessment   Upper Extremity Assessment Upper Extremity Assessment: Overall WFL for tasks assessed    Lower Extremity Assessment Lower Extremity Assessment: Overall WFL for tasks assessed    Cervical / Trunk Assessment Cervical / Trunk Assessment: Other exceptions Cervical / Trunk Exceptions: increased abdominal girth. Discussed diet and exercise as well as core activation  Communication   Communication: No difficulties  Cognition Arousal/Alertness: Awake/alert Behavior During Therapy: WFL for tasks assessed/performed Overall Cognitive  Status: Within Functional Limits for tasks assessed                                        General Comments General comments (skin integrity, edema, etc.): discussed activity level at d/c    Exercises     Assessment/Plan    PT Assessment Patent does not need any further PT services  PT Problem List         PT  Treatment Interventions      PT Goals (Current goals can be found in the Care Plan section)  Acute Rehab PT Goals Patient Stated Goal: return home PT Goal Formulation: All assessment and education complete, DC therapy    Frequency     Barriers to discharge        Co-evaluation               AM-PAC PT "6 Clicks" Daily Activity  Outcome Measure Difficulty turning over in bed (including adjusting bedclothes, sheets and blankets)?: A Little Difficulty moving from lying on back to sitting on the side of the bed? : A Little Difficulty sitting down on and standing up from a chair with arms (e.g., wheelchair, bedside commode, etc,.)?: A Little Help needed moving to and from a bed to chair (including a wheelchair)?: None Help needed walking in hospital room?: None Help needed climbing 3-5 steps with a railing? : None 6 Click Score: 21    End of Session Equipment Utilized During Treatment: Gait belt Activity Tolerance: Patient tolerated treatment well Patient left: in chair;with call bell/phone within reach Nurse Communication: Mobility status PT Visit Diagnosis: Pain Pain - part of body: (back)    Time: [id number] PT Time Calculation (min) (ACUTE ONLY): 20 min   Charges:   PT Evaluation $PT Eval Low Complexity: 1 Low     PT G Codes:   PT G-Codes **NOT FOR INPATIENT CLASS** Functional Assessment Tool Used: AM-PAC 6 Clicks Basic Mobility Functional Limitation: Mobility: Walking and moving around Mobility: Walking and Moving Around Current Status ([id number]): At least 20 percent but less than 40 percent impaired, limited or restricted Mobility: Walking and Moving Around Goal Status [phone]): At least 20 percent but less than 40 percent impaired, limited or restricted Mobility: Walking and Moving Around Discharge Status [phone](G8980): At least 20 percent but less than 40 percent impaired, limited or restricted    LuxembourgVictoria Gwendlyn Hanback, PT  Acute Rehab Services  [phone]   Lawana ChambersVictoria L  Kinslee Dalpe 05/16/2017, 9:54 AM

## 2017-05-16 NOTE — Progress Notes (Signed)
OT Cancellation Note  Patient Details Name: Victor Oconnor MRN: [id number] DOB: April 14, 1971   Cancelled Treatment:    Reason Eval/Treat Not Completed: OT screened, no needs identified, will sign off  Brondon Wann A Gwenith Tschida 05/16/2017, 10:42 AM

## 2017-05-16 NOTE — Progress Notes (Signed)
Discharged from floor via w/c for transport home by car. Belongings & S.O. with pt. No changes in assessment. Rhyanna Sorce  

## 2017-05-16 NOTE — Op Note (Signed)
NAME:  Victor Oconnor, Victor Oconnor               ACCOUNT NO.:  [id number]  MEDICAL RECORD NO.:  [id number]  LOCATION:                                 FACILITY:  PHYSICIAN:  Georges Lynchonald A. Cheila Wickstrom, M.D.DATE OF BIRTH:  January 24, 1971  DATE OF PROCEDURE:  05/15/2017 DATE OF DISCHARGE:                              OPERATIVE REPORT   SURGEON:  Georges Lynchonald A. Darrelyn HillockGioffre, M.D.  ASSISTANT:  Dimitri PedAmber Constable, P.A.  PREOPERATIVE DIAGNOSES: 1. A large herniated lumbar disk at L4-L5 on the right. 2. Severe facet arthritis at the inferior facet at L4-L5 on the right. 3. Synovial cyst from this facet at L4-L5 on the right. 4. A partial footdrop on the right. 5. Spinal stenosis at L4-L5.  POSTOPERATIVE DIAGNOSES: 1. A large herniated lumbar disk at L4-L5 on the right. 2. Severe facet arthritis at the inferior facet at L4-L5 on the right. 3. Synovial cyst from this facet at L4-L5 on the right. 4. A partial footdrop on the right. 5. Spinal stenosis at L4-L5.  OPERATION: 1. Microdiskectomy at L4-L5 on the right. 2. Partial facetectomy, inferior facet of L4-L5 on the right. 3. Complete decompressive lumbar laminectomy at L4-L5 for stenosis     with decompression of lateral recess at L4-L5 on right. 4. Foraminotomy for the L5 root on the right.  DESCRIPTION OF PROCEDURE:  Under general anesthesia with the patient on the spinal frame, a routine orthopedic prep and draping of the lower back was carried out.  At this time, the appropriate time-out was carried out.  I also marked the appropriate right side of the back in the holding area.  Given the brief history, this gentleman had to be admitted through the office because of a progressive loss of function in his right foot and because of uncontrollable pain despite heavy narcotics at home.  We took him to the operating room today as a semi emergency procedure.  At this time, after the prep and draping and time- out, 2 needles were placed in the back for localization  purposes, x-ray was taken.  At this time, an incision was made over the L4-L5 space. The incision was then extended distally and proximally in the usual fashion.  At this time, I then stripped the muscle from the lamina and spinous process bilaterally.  Another x-ray was taken with a Kocher clamp in place.  At this time, the self retaining McCullough retractors were inserted.  I then removed the spinous process of L4 to completely decompress the L4-L5 space.  Note, this space was extremely tight. There were shingling of the lamina at this point, and we had to definitely go central for this.  Also, microscope was brought in.  We identified the ligamentum flavum.  There was a large synovial cyst at the inferior facet there at L4-5 on the right, we removed that.  We then went out and decompressed the lateral recess and did a partial facetectomy to free up the nerve root and the dura.  He had a marked overgrowth of bone in that area in the lateral recess on the right.  I went up proximally and distally as far as we could until we made sure we had  a complete decompression done.  We then gently retracted a dura, took another x-ray to verify our position.  Following that, we made a cruciate incision in the posterior longitudinal ligament and then did our microdiskectomy.  I then went up onto the ligamentum flavum with a nerve hook and the Epstein curette to totally free up the roots above and below.  I went out into the foramina and decompressed the disk and the foramen at this area with the Epstein curettes.  Once we did the decompression, I further checked the area out with a hockey stick.  We were able to easily pass a hockey-stick out the L5 root and the L4 root above as well.  We explored the recess proximal and distal to the disk space.  As I mentioned, an instrument was placed in the space and we verified the L4-5 space.  I thoroughly irrigated out the area, loosely applied some  thrombin-soaked Gelfoam and then closed the wound in layers in usual fashion.  I left a small distal and proximal deep part of the wound open for drainage purposes.  Remaining part of the subcu was closed with 2-0 Vicryl, skin with metal staples.  At the beginning of the procedure, the patient had 1 g of vancomycin.  I also injected 20 mL of 0.5% Marcaine and epinephrine into the soft tissue to control bleeding at the beginning of the case.  At the end of the case, I injected 20 mL of Exparel into the soft tissue for pain relief.  The patient will be kept in the hospital overnight.  A disk specimen was sent to the lab from the L4-5 interspace.          ______________________________ Georges Lynchonald A. Darrelyn HillockGioffre, M.D.     RAG/MEDQ  D:  05/15/2017  T:  05/16/2017  Job:  [id number]

## 2017-05-16 NOTE — Plan of Care (Signed)
Plan of care reviewed with patient.  Verbalized u/o plan.

## 2017-05-16 NOTE — Progress Notes (Signed)
Subjective: 1 Day Post-Op Procedure(s) (LRB): Hemilaminectomy lumbar microdisectomy L4-L5 right, decompression (N/A) Patient reports pain as 2 on 0-10 scale. Back pain only. His leg pain is absent and his foot drop has resolved.   Objective: Vital signs in last 24 hours: Temp:  [97.5 F (36.4 C)-98.8 F (37.1 C)] 97.8 F (36.6 C) (12/01 0531) Pulse Rate:  [62-99] 63 (12/01 0531) Resp:  [11-20] 18 (12/01 0531) BP: (131-155)/(74-99) 144/88 (12/01 0531) SpO2:  [91 %-100 %] 100 % (12/01 0531)  Intake/Output from previous day: 11/30 0701 - 12/01 0700 In: 3930 [P.O.:1520; I.V.:1910; IV Piggyback:500] Out: 3800 [Urine:3750; Blood:50] Intake/Output this shift: No intake/output data recorded.  Recent Labs    05/14/17 1710  HGB 11.8*   Recent Labs    05/14/17 1710  WBC 12.1*  RBC 4.46  HCT 36.9*  PLT 285   Recent Labs    05/14/17 1710  NA 136  K 4.1  CL 101  CO2 29  BUN 12  CREATININE 0.80  GLUCOSE 115*  CALCIUM 8.8*   Recent Labs    05/14/17 1710  INR 0.93    Dorsiflexion/Plantar flexion intact  Assessment/Plan: 1 Day Post-Op Procedure(s) (LRB): Hemilaminectomy lumbar microdisectomy L4-L5 right, decompression (N/A) Up with therapy,home today.  Ranee GosselinRonald Sahar Ryback 05/16/2017, 7:34 AM

## 2017-05-18 ENCOUNTER — Encounter (HOSPITAL_COMMUNITY): Payer: Self-pay | Admitting: Orthopedic Surgery

## 2017-05-18 NOTE — Discharge Summary (Signed)
Physician Discharge Summary   Patient ID: Victor Oconnor MRN: [id number] DOB/AGE: Jun 01, 1971 46 y.o.  Admit date: 05/14/2017 Discharge date: 05/16/2017  Primary Diagnosis: Lumbar spinal stenosis and disc herniation L4-L5 right   Admission Diagnoses:  Past Medical History:  Diagnosis Date  . Anxiety   . Depression   . Hyperlipidemia   . Hypertension    no meds for hypertension  . Hypothyroidism   . Muscle twitch   . Pain    severe pain left shoulder   s/p left shoulder rotator cuff repair on 01/14/12  . Sleep apnea    prior to gastric bypass 2011- no cpap at present   Discharge Diagnoses:   Active Problems:   Lumbar disc herniation with radiculopathy   Spinal stenosis of lumbar region with neurogenic claudication  Estimated body mass index is 33.25 kg/m as calculated from the following:   Height as of this encounter: _0  (1.854 m).   Weight as of this encounter: 114.3 kg (252 lb).  Procedure:  Procedure(s) (LRB): Hemilaminectomy lumbar microdisectomy L4-L5 right, decompression (N/A)   Consults: None  HPI: The patient reports low back symptoms including low back pain which began2 weeks agowithout any known injury. Symptoms include pain. The pain radiates to the right buttock, right thigh and right lower leg. The patient describes the severity of their symptoms as moderate in severity. Prior to being seen today the patient was previously evaluated in the emergency room. Symptoms present at the patient's previous evaluation included back pain. Past evaluation has included x-ray of the lumbar spine. Past treatment has included corticosteroids. He reports that he did not recall an injury or change in activity that could have caused the pain. He says that the pain is continuing to get worse despite rest and Prednisone. He says that he has had flares of pain in his low back before but not this severe. He had an MRI in 2104 which showed a disc bulge at L4-L5, L5-S1 but no specific  treatment at that time other than activity modification. No change in bladder or bowel function. He is having pain at all times. He is unable to sleep. He feels like the right leg is going to give out on him. He has numbness in the right lower leg. MRI showed a large herniated disc at L4-L5 on the right. He was scheduled for surgery in later December. Unfortunately, he was experiencing severe pain that required admission to the hospital.  Laboratory Data: Admission on 05/14/2017, Discharged on 05/16/2017  Component Date Value Ref Range Status  . WBC 05/14/2017 12.1* 4.0 - 10.5 K/uL Final  . RBC 05/14/2017 4.46  4.22 - 5.81 MIL/uL Final  . Hemoglobin 05/14/2017 11.8* 13.0 - 17.0 g/dL Final  . HCT 05/14/2017 36.9* 39.0 - 52.0 % Final  . MCV 05/14/2017 82.7  78.0 - 100.0 fL Final  . MCH 05/14/2017 26.5  26.0 - 34.0 pg Final  . MCHC 05/14/2017 32.0  30.0 - 36.0 g/dL Final  . RDW 05/14/2017 15.7* 11.5 - 15.5 % Final  . Platelets 05/14/2017 285  150 - 400 K/uL Final  . Neutrophils Relative % 05/14/2017 80  % Final  . Neutro Abs 05/14/2017 9.7* 1.7 - 7.7 K/uL Final  . Lymphocytes Relative 05/14/2017 15  % Final  . Lymphs Abs 05/14/2017 1.8  0.7 - 4.0 K/uL Final  . Monocytes Relative 05/14/2017 4  % Final  . Monocytes Absolute 05/14/2017 0.5  0.1 - 1.0 K/uL Final  . Eosinophils Relative 05/14/2017 1  %  Final  . Eosinophils Absolute 05/14/2017 0.1  0.0 - 0.7 K/uL Final  . Basophils Relative 05/14/2017 0  % Final  . Basophils Absolute 05/14/2017 0.1  0.0 - 0.1 K/uL Final  . Sodium 05/14/2017 136  135 - 145 mmol/L Final  . Potassium 05/14/2017 4.1  3.5 - 5.1 mmol/L Final  . Chloride 05/14/2017 101  101 - 111 mmol/L Final  . CO2 05/14/2017 29  22 - 32 mmol/L Final  . Glucose, Bld 05/14/2017 115* 65 - 99 mg/dL Final  . BUN 05/14/2017 12  6 - 20 mg/dL Final  . Creatinine, Ser 05/14/2017 0.80  0.61 - 1.24 mg/dL Final  . Calcium 05/14/2017 8.8* 8.9 - 10.3 mg/dL Final  . Total Protein 05/14/2017 7.0   6.5 - 8.1 g/dL Final  . Albumin 05/14/2017 3.6  3.5 - 5.0 g/dL Final  . AST 05/14/2017 16  15 - 41 U/L Final  . ALT 05/14/2017 15* 17 - 63 U/L Final  . Alkaline Phosphatase 05/14/2017 49  38 - 126 U/L Final  . Total Bilirubin 05/14/2017 0.3  0.3 - 1.2 mg/dL Final  . GFR calc non Af Amer 05/14/2017 >60  >60 mL/min Final  . GFR calc Af Amer 05/14/2017 >60  >60 mL/min Final   Comment: (NOTE) The eGFR has been calculated using the CKD EPI equation. This calculation has not been validated in all clinical situations. eGFR's persistently <60 mL/min signify possible Chronic Kidney Disease.   . Anion gap 05/14/2017 6  5 - 15 Final  . Prothrombin Time 05/14/2017 12.4  11.4 - 15.2 seconds Final  . INR 05/14/2017 0.93   Final  . aPTT 05/14/2017 27  24 - 36 seconds Final  . Color, Urine 05/15/2017 STRAW* YELLOW Final  . APPearance 05/15/2017 CLEAR  CLEAR Final  . Specific Gravity, Urine 05/15/2017 1.005  1.005 - 1.030 Final  . pH 05/15/2017 7.0  5.0 - 8.0 Final  . Glucose, UA 05/15/2017 NEGATIVE  NEGATIVE mg/dL Final  . Hgb urine dipstick 05/15/2017 NEGATIVE  NEGATIVE Final  . Bilirubin Urine 05/15/2017 NEGATIVE  NEGATIVE Final  . Ketones, ur 05/15/2017 NEGATIVE  NEGATIVE mg/dL Final  . Protein, ur 05/15/2017 NEGATIVE  NEGATIVE mg/dL Final  . Nitrite 05/15/2017 NEGATIVE  NEGATIVE Final  . Leukocytes, UA 05/15/2017 NEGATIVE  NEGATIVE Final  . MRSA, PCR 05/14/2017 NEGATIVE  NEGATIVE Final  . Staphylococcus aureus 05/14/2017 POSITIVE* NEGATIVE Final   Comment: (NOTE) The Xpert SA Assay (FDA approved for NASAL specimens in patients 50 years of age and older), is one component of a comprehensive surveillance program. It is not intended to diagnose infection nor to guide or monitor treatment.      X-Rays:Dg Lumbar Spine 2-3 Views  Result Date: 05/14/2017 CLINICAL DATA:  46 y/o  M; preop herniated disc. EXAM: LUMBAR SPINE - 2-3 VIEW COMPARISON:  04/29/2017 lumbar spine radiographs. FINDINGS:  Five lumbar type non-rib-bearing vertebral bodies. Mild stable loss of intervertebral disc space height at L4-5 and L5-S1 levels. No loss of vertebral body height. Normal lumbar lordosis without listhesis. IMPRESSION: Five lumbar type non-rib-bearing vertebral bodies. Stable mild lower lumbar spondylosis. Electronically Signed   By: Kristine Garbe M.D.   On: 05/14/2017 20:09   Dg Spine Portable 1 View  Result Date: 05/15/2017 CLINICAL DATA:  Lumbar disc disease. EXAM: PORTABLE SPINE - 1 VIEW COMPARISON:  Radiographs dated 05/15/2017 and 05/14/2017 FINDINGS: Image 4 demonstrates an instrument the level of the L4-5 disc space. IMPRESSION: Instrument at the L4-5 disc space. Electronically  Signed   By: Lorriane Shire M.D.   On: 05/15/2017 15:42   Dg Spine Portable 1 View  Result Date: 05/15/2017 CLINICAL DATA:  Lumbar surgery. EXAM: PORTABLE SPINE - 1 VIEW COMPARISON:  05/15/2017. FINDINGS: Lumbar vertebrae numbered as per prior study . Metallic marker noted posteriorly at L4-L5. Diffuse degenerative change. Postsurgical changes lower lumbar spine. No acute bony abnormality . IMPRESSION: Metallic marker noted posteriorly at L4-L5. Electronically Signed   By: Marcello Moores  Register   On: 05/15/2017 14:42   Dg Spine Portable 1 View  Result Date: 05/15/2017 CLINICAL DATA:  Lumbar spine surgery. EXAM: PORTABLE SPINE - 1 VIEW COMPARISON:  Earlier today.  05/14/2017. FINDINGS: Metallic localizer with its tip projected over the L4 spinous process. Mild anterior spur formation at the L3-4 through L5-S1 levels and mild posterior spur formation at the L5-S1 level. IMPRESSION: Localizer at the L4 level. Electronically Signed   By: Claudie Revering M.D.   On: 05/15/2017 14:23   Dg Spine Portable 1 View  Result Date: 05/15/2017 CLINICAL DATA:  Lumbar surgery. EXAM: PORTABLE SPINE - 1 VIEW COMPARISON:  MRI of April 12, 2013. Radiographs of May 14, 2017. FINDINGS: Single intraoperative cross-table lateral  projection of the lumbar spine was obtained. These images demonstrate 1 surgical probe projected over posterior spinous process of L4, with another directed at posterior interspinous space of L4-5. IMPRESSION: Surgical localization as described above. Electronically Signed   By: Marijo Conception, M.D.   On: 05/15/2017 14:06    EKG: Orders placed or performed during the hospital encounter of 05/14/17  . EKG 12-Lead  . EKG 12-Lead     Hospital Course: Zoe Nordin is a 46 y.o. who was admitted to [facility]. They were brought to the operating room on 05/15/2017 and underwent Procedure(s): Hemilaminectomy lumbar microdisectomy L4-L5 right, decompression.  Patient tolerated the procedure well and was later transferred to the recovery room and then to the orthopaedic floor for postoperative care.  They were given PO and IV analgesics for pain control following their surgery.  They were given 24 hours of postoperative antibiotics of  Anti-infectives (From admission, onward)   Start     Dose/Rate Route Frequency Ordered Stop   05/16/17 0100  vancomycin (VANCOCIN) 1,500 mg in sodium chloride 0.9 % 500 mL IVPB     1,500 mg 250 mL/hr over 120 Minutes Intravenous  Once 05/15/17 1910 05/16/17 0300   05/15/17 1427  polymyxin B 500,000 Units, bacitracin 50,000 Units in sodium chloride 0.9 % 500 mL irrigation  Status:  Discontinued       As needed 05/15/17 1427 05/15/17 1602   05/15/17 1300  vancomycin (VANCOCIN) IVPB 1000 mg/200 mL premix     1,000 mg 200 mL/hr over 60 Minutes Intravenous On call to O.R. 05/14/17 1637 05/15/17 1349    PT was ordered to ambulate the patient.  Discharge planning consulted to help with postop disposition and equipment needs.  Patient had a fair night on the evening of surgery.  They started to get up OOB with therapy on day one.  Dressing was changed and the incision was clean and dry.  The patient had progressed with therapy and meeting their goals.  Incision was  healing well.  Patient was seen in rounds and was ready to go home.   Diet: Cardiac diet Activity:WBAT Follow-up:in 2 weeks Disposition - Home Discharged Condition: stable   Discharge Instructions    Call MD / Call 911   Complete by:  As  directed    If you experience chest pain or shortness of breath, CALL 911 and be transported to the hospital emergency room.  If you develope a fever above 101 F, pus (white drainage) or increased drainage or redness at the wound, or calf pain, call your surgeon's office.   Constipation Prevention   Complete by:  As directed    Drink plenty of fluids.  Prune juice may be helpful.  You may use a stool softener, such as Colace (over the counter) 100 mg twice a day.  Use MiraLax (over the counter) for constipation as needed.   Diet - low sodium heart healthy   Complete by:  As directed    Discharge instructions   Complete by:  As directed    For the first three days, remove your dressing, and tape a piece of saran wrap over your incision, Take your shower, then remove the saran wrap and put a clean dressing on After three days you can shower without the saran wrap.  No lifting or excessive bending No driving while taking pain medications Call Dr. Gladstone Lighter if any wound complications or temperature of 101 degrees F or over.  Call the office for an appointment to see Dr. Gladstone Lighter in two weeks: [phone] and ask for Dr. Charlestine Night nurse, Brunilda Payor.   Increase activity slowly as tolerated   Complete by:  As directed      Allergies as of 05/16/2017      Reactions   Penicillins Nausea And Vomiting   Has patient had a PCN reaction causing immediate rash, facial/tongue/throat swelling, SOB or lightheadedness with hypotension: No Has patient had a PCN reaction causing severe rash involving mucus membranes or skin necrosis: No Has patient had a PCN reaction that required hospitalization:No Has patient had a PCN reaction occurring within the last 10 years:  No If all of the above answers are "NO", then may proceed with Cephalosporin use.      Medication List    STOP taking these medications   HYDROcodone-acetaminophen 5-325 MG tablet Commonly known as:  NORCO/VICODIN   HYDROmorphone 2 MG tablet Commonly known as:  DILAUDID   predniSONE 10 MG tablet Commonly known as:  DELTASONE   predniSONE 20 MG tablet Commonly known as:  DELTASONE     TAKE these medications   buPROPion 150 MG 12 hr tablet Commonly known as:  WELLBUTRIN SR Take 150 mg by mouth every morning.   DULoxetine 60 MG capsule Commonly known as:  CYMBALTA Take 60 mg by mouth 2 (two) times daily. 60   escitalopram 5 MG tablet Commonly known as:  LEXAPRO Take 5 mg by mouth every morning.   gabapentin 300 MG capsule Commonly known as:  NEURONTIN Take 300 mg by mouth 3 (three) times daily.   indapamide 1.25 MG tablet Commonly known as:  LOZOL Take 1.25 mg by mouth daily.   levothyroxine 200 MCG tablet Commonly known as:  SYNTHROID, LEVOTHROID Take 200 mcg by mouth daily before breakfast.   lisinopril 10 MG tablet Commonly known as:  PRINIVIL,ZESTRIL Take 10 mg by mouth every morning.   methocarbamol 500 MG tablet Commonly known as:  ROBAXIN Take 1 tablet (500 mg total) by mouth every 6 (six) hours as needed for muscle spasms.   metoprolol tartrate 25 MG tablet Commonly known as:  LOPRESSOR Take 25 mg by mouth daily.   oxyCODONE-acetaminophen 10-325 MG tablet Commonly known as:  PERCOCET Take 1 tablet by mouth every 4 (four) hours as needed for  pain.      Follow-up Information    Latanya Maudlin, MD. Schedule an appointment as soon as possible for a visit in 2 week(s).   Specialty:  Orthopedic Surgery Contact information: [address] [place] [address] [phone]           Signed: Ardeen Jourdain, PA-C Orthopaedic Surgery 05/18/2017, 10:52 AM

## 2017-05-18 NOTE — Anesthesia Postprocedure Evaluation (Signed)
Anesthesia Post Note  Patient: Victor Oconnor  Procedure(s) Performed: Hemilaminectomy lumbar microdisectomy L4-L5 right, decompression (N/A )     Patient location during evaluation: PACU Anesthesia Type: General Level of consciousness: awake and alert Pain management: pain level controlled Vital Signs Assessment: post-procedure vital signs reviewed and stable Respiratory status: spontaneous breathing, nonlabored ventilation, respiratory function stable and patient connected to nasal cannula oxygen Cardiovascular status: blood pressure returned to baseline and stable Postop Assessment: no apparent nausea or vomiting Anesthetic complications: no    Last Vitals:  Vitals:   05/16/17 0110 05/16/17 0531  BP: 136/76 (!) 144/88  Pulse: 76 63  Resp: 16 18  Temp: 36.7 C 36.6 C  SpO2: 99% 100%    Last Pain:  Vitals:   05/16/17 1140  TempSrc:   PainSc: 4                  [name]

## 2017-06-02 ENCOUNTER — Encounter (HOSPITAL_COMMUNITY): Payer: Self-pay

## 2017-07-11 DIAGNOSIS — M961 Postlaminectomy syndrome, not elsewhere classified: Secondary | ICD-10-CM | POA: Diagnosis not present

## 2017-07-11 DIAGNOSIS — Z79891 Long term (current) use of opiate analgesic: Secondary | ICD-10-CM | POA: Diagnosis not present

## 2017-07-11 DIAGNOSIS — M5136 Other intervertebral disc degeneration, lumbar region: Secondary | ICD-10-CM | POA: Diagnosis not present

## 2017-07-11 DIAGNOSIS — G894 Chronic pain syndrome: Secondary | ICD-10-CM | POA: Diagnosis not present

## 2017-08-08 DIAGNOSIS — M545 Low back pain: Secondary | ICD-10-CM | POA: Diagnosis not present

## 2017-08-08 DIAGNOSIS — Z79891 Long term (current) use of opiate analgesic: Secondary | ICD-10-CM | POA: Diagnosis not present

## 2017-08-08 DIAGNOSIS — M5136 Other intervertebral disc degeneration, lumbar region: Secondary | ICD-10-CM | POA: Diagnosis not present

## 2017-09-21 DIAGNOSIS — F419 Anxiety disorder, unspecified: Secondary | ICD-10-CM | POA: Diagnosis not present

## 2017-09-21 DIAGNOSIS — I10 Essential (primary) hypertension: Secondary | ICD-10-CM | POA: Diagnosis not present

## 2017-09-21 DIAGNOSIS — R634 Abnormal weight loss: Secondary | ICD-10-CM | POA: Diagnosis not present

## 2017-09-21 DIAGNOSIS — E039 Hypothyroidism, unspecified: Secondary | ICD-10-CM | POA: Diagnosis not present

## 2017-09-29 ENCOUNTER — Emergency Department (HOSPITAL_COMMUNITY): Payer: PPO

## 2017-09-29 ENCOUNTER — Emergency Department (HOSPITAL_COMMUNITY)
Admission: EM | Admit: 2017-09-29 | Discharge: 2017-09-29 | Disposition: A | Discharge status: Home / Self Care | Payer: PPO | Attending: Emergency Medicine | Admitting: Emergency Medicine

## 2017-09-29 ENCOUNTER — Other Ambulatory Visit: Payer: Self-pay

## 2017-09-29 ENCOUNTER — Encounter (HOSPITAL_COMMUNITY): Payer: Self-pay

## 2017-09-29 DIAGNOSIS — R109 Unspecified abdominal pain: Secondary | ICD-10-CM | POA: Diagnosis not present

## 2017-09-29 DIAGNOSIS — R112 Nausea with vomiting, unspecified: Secondary | ICD-10-CM | POA: Insufficient documentation

## 2017-09-29 DIAGNOSIS — I10 Essential (primary) hypertension: Secondary | ICD-10-CM | POA: Insufficient documentation

## 2017-09-29 DIAGNOSIS — R197 Diarrhea, unspecified: Secondary | ICD-10-CM | POA: Insufficient documentation

## 2017-09-29 DIAGNOSIS — R1010 Upper abdominal pain, unspecified: Secondary | ICD-10-CM | POA: Diagnosis not present

## 2017-09-29 DIAGNOSIS — F17210 Nicotine dependence, cigarettes, uncomplicated: Secondary | ICD-10-CM | POA: Insufficient documentation

## 2017-09-29 DIAGNOSIS — Z79899 Other long term (current) drug therapy: Secondary | ICD-10-CM | POA: Insufficient documentation

## 2017-09-29 DIAGNOSIS — Z9884 Bariatric surgery status: Secondary | ICD-10-CM | POA: Diagnosis not present

## 2017-09-29 LAB — COMPREHENSIVE METABOLIC PANEL
ALT: 17 U/L (ref 17–63)
ANION GAP: 10 (ref 5–15)
AST: 22 U/L (ref 15–41)
Albumin: 3.9 g/dL (ref 3.5–5.0)
Alkaline Phosphatase: 68 U/L (ref 38–126)
BUN: 10 mg/dL (ref 6–20)
CHLORIDE: 102 mmol/L (ref 101–111)
CO2: 24 mmol/L (ref 22–32)
Calcium: 9.5 mg/dL (ref 8.9–10.3)
Creatinine, Ser: 0.93 mg/dL (ref 0.61–1.24)
GFR calc non Af Amer: >60 mL/min (ref >60)
Glucose, Bld: 111 mg/dL — ABNORMAL HIGH (ref 65–99)
POTASSIUM: 3.5 mmol/L (ref 3.5–5.1)
SODIUM: 136 mmol/L (ref 135–145)
Total Bilirubin: 0.5 mg/dL (ref 0.3–1.2)
Total Protein: 8.2 g/dL — ABNORMAL HIGH (ref 6.5–8.1)

## 2017-09-29 LAB — CBC
HEMATOCRIT: 37.6 % — AB (ref 39.0–52.0)
HEMOGLOBIN: 12.7 g/dL — AB (ref 13.0–17.0)
MCH: 27.1 pg (ref 26.0–34.0)
MCHC: 33.8 g/dL (ref 30.0–36.0)
MCV: 80.2 fL (ref 78.0–100.0)
Platelets: 336 10*3/uL (ref 150–400)
RBC: 4.69 MIL/uL (ref 4.22–5.81)
RDW: 15.7 % — ABNORMAL HIGH (ref 11.5–15.5)
WBC: 7.9 10*3/uL (ref 4.0–10.5)

## 2017-09-29 LAB — TYPE AND SCREEN
ABO/RH(D): A POS
Antibody Screen: NEGATIVE

## 2017-09-29 LAB — I-STAT CHEM 8, ED
BUN: 8 mg/dL (ref 6–20)
Calcium, Ion: 1.13 mmol/L — ABNORMAL LOW (ref 1.15–1.40)
Chloride: 98 mmol/L — ABNORMAL LOW (ref 101–111)
Creatinine, Ser: 0.9 mg/dL (ref 0.61–1.24)
GLUCOSE: 103 mg/dL — AB (ref 65–99)
HEMATOCRIT: 41 % (ref 39.0–52.0)
HEMOGLOBIN: 13.9 g/dL (ref 13.0–17.0)
POTASSIUM: 3.3 mmol/L — AB (ref 3.5–5.1)
Sodium: 135 mmol/L (ref 135–145)
TCO2: 25 mmol/L (ref 22–32)

## 2017-09-29 LAB — LIPASE, BLOOD: LIPASE: 29 U/L (ref 11–51)

## 2017-09-29 LAB — POC OCCULT BLOOD, ED: FECAL OCCULT BLD: NEGATIVE

## 2017-09-29 MED ORDER — SODIUM CHLORIDE 0.9 % IV BOLUS
1000.0000 mL | Freq: Once | INTRAVENOUS | Status: AC
  Administered 2017-09-29: 1000 mL via INTRAVENOUS

## 2017-09-29 MED ORDER — HYDROXYZINE HCL 25 MG PO TABS: 25.0000 mg | ORAL_TABLET | Freq: Four times a day (QID) | ORAL | 0 refills | Status: DC

## 2017-09-29 MED ORDER — IOPAMIDOL (ISOVUE-300) INJECTION 61%
100.0000 mL | Freq: Once | INTRAVENOUS | Status: AC | PRN
  Administered 2017-09-29: 100 mL via INTRAVENOUS

## 2017-09-29 MED ORDER — ONDANSETRON HCL 4 MG PO TABS: 4.0000 mg | ORAL_TABLET | Freq: Three times a day (TID) | ORAL | 0 refills | Status: DC | PRN

## 2017-09-29 MED ORDER — ONDANSETRON HCL 4 MG/2ML IJ SOLN
4.0000 mg | Freq: Once | INTRAMUSCULAR | Status: AC
  Administered 2017-09-29: 4 mg via INTRAVENOUS
  Filled 2017-09-29: qty 2

## 2017-09-29 MED ORDER — GI COCKTAIL ~~LOC~~
30.0000 mL | Freq: Once | ORAL | Status: AC
  Administered 2017-09-29: 30 mL via ORAL
  Filled 2017-09-29: qty 30

## 2017-09-29 MED ORDER — IOPAMIDOL (ISOVUE-300) INJECTION 61%
INTRAVENOUS | Status: AC
  Filled 2017-09-29: qty 100

## 2017-09-29 MED ORDER — MORPHINE SULFATE (PF) 4 MG/ML IV SOLN
4.0000 mg | Freq: Once | INTRAVENOUS | Status: AC
  Administered 2017-09-29: 4 mg via INTRAVENOUS
  Filled 2017-09-29: qty 1

## 2017-09-29 MED ORDER — OMEPRAZOLE 20 MG PO CPDR: 20.0000 mg | DELAYED_RELEASE_CAPSULE | Freq: Every day | ORAL | 0 refills | Status: DC

## 2017-09-29 NOTE — Discharge Instructions (Addendum)
Please read and follow all provided instructions You have been seen today for your complaint of: abdominal pain Your lab work: was reassuring Your imaging: CT scan of the abdomen and RUQ US were reassuring Your black stool is likely related to the iron therapy your recently started. Your fecal occult blood test was negative.  Vital signs: See below  Abdominal Pain  Your exam might not show the exact reason you have abdominal pain. Since there are many different causes of abdominal pain, another checkup and more tests may be needed. It is very important to follow up for lasting (persistent) or worsening symptoms. A possible cause of abdominal pain in any person who still has his or her appendix is acute appendicitis. Appendicitis is often hard to diagnose. Normal blood tests, urine tests, ultrasound, and CT scans do not completely rule out early appendicitis or other causes of abdominal pain. Sometimes, only the changes that happen over time will allow appendicitis and other causes of abdominal pain to be determined. Other potential problems that may require surgery may also take time to become more apparent. Because of this, it is important that you follow all of the instructions below.   HOME CARE INSTRUCTIONS  Do not take laxatives unless directed by your caregiver. Rest as much as possible.  Do not eat solid food until your pain is gone: A diet of water, weak decaffeinated tea, broth or bouillon, gelatin, oral rehydration solutions (ORS), frozen ice pops, or ice chips may be helpful.  When pain is gone: Start a light diet (dry toast, crackers, applesauce, or white rice). Increase the diet slowly as long as it does not bother you. Eat no dairy products (including cheese and eggs) and no spicy, fatty, fried, or high-fiber foods.  Use no alcohol, caffeine, or cigarettes.  Take your regular medicines unless your caregiver told you not to.  Take any prescribed medicine as directed.   SEEK IMMEDIATE  MEDICAL CARE IF:  The pain does not go away.  You have a fever >101 that does not go down with medication. You keep throwing up (vomiting) or cannot drink liquids.  You have shaking chills (rigors) The pain becomes localized (Pain in the right side could possibly be appendicitis. In an adult, pain in the left lower portion of the abdomen could be colitis or diverticulitis). You pass bloody stools.  There is bright red blood in the stool.  There is blood in your vomit. Your bowel movements stop (become blocked) or you cannot pass gas.  The constipation stays for more than 4 days.  You have bloody, frequent, or painful urination.  You have yellow discoloration in the skin or whites of the eyes.  Your stomach becomes bloated or bigger.  You have dizziness or fainting.  You have chest or back pain. You have rectal pain.  You do not seem to be getting better.  You have any questions or concerns.   Your vital signs today were: BP 120/77    Pulse 71    Temp 97.6 F (36.4 C) (Oral)    Resp (!) 21    Ht 6\' 1"  (1.854 m)    Wt 111.6 kg (246 lb)    SpO2 99%    BMI 32.46 kg/m  If your blood pressure (bp) was elevated above 135/85 this visit, please have this repeated by your doctor within one month.

## 2017-09-29 NOTE — ED Triage Notes (Signed)
Patient had a gastric bypass 7-8 years ago. Patient states he has had black diarrheal stool , Nausea x 2 days. Patient also c/o fatigue.

## 2017-09-29 NOTE — ED Provider Notes (Signed)
[facility]-EMERGENCY DEPT Provider Note   CSN: [id number] Arrival date & time: 09/29/17  1716     History   Chief Complaint Chief Complaint  Patient presents with  . Abdominal Pain  . Nausea  . Fatigue  . Diarrhea  . GI Bleeding    HPI Fuller Makin is a 47 y.o. male with a history of hypertension, hyperlipidemia, hypothyroidism, prior gastric bypass surgery (done by Dr. Daphine Deutscher of [facility]) who presents the emergency department today for abdominal pain.  Patient states that over the last 1 month he has been having increasing episodes where he has felt generalized fatigue.  He states that he saw his primary care doctor 1 week ago and was told that he had iron deficiency anemia and was started on oral iron replacement.  He states that over the last 3 days he has noticed that he started having black, tarry diarrhea 3 times per day.  He was told when he had his gastric bypass surgery this could mean that he has a complication so he was concerned and that is the reason for his presentation.  He also notes that over the last 3 days he has been having intermittent episodes of upper abdominal pain that commonly occurs after he drinks soda but also whenever he becomes nauseous and has episodes of nonbilious, nonbloody emesis or episodes of diarrhea as previously mentioned. He notes that his pain is usually on the left upper quadrant or epigastric area and last for several minutes before dissipating.  The patient states that he is on daily Percocets that he takes for his pain without relief.  He notes that he does drink 2-3 shots of alcohol 2 times a week to help him sleep.  He denies daily alcohol use.  He denies any NSAID or steroid use.  Previous history of ulcer.  He does state that he has been more stressed recently, working 60-hour plus work weeks.  Patient reports his last episode of nonbilious, nonbloody emesis was around 12 PM today.  He has been taking his  girlfriend's Phenergan to help him with his nausea and feels this is been helpful. Patient has not followed up with [facility] many years.  He states his current pain level is a 2/10.  He denies any associated fever, chills, chest pain, shortness of breath, lower abdominal pain, urinary symptoms.  He reports that he had a gastric bypass as well as a hernia surgery as a child but denies any other abdominal surgeries.  He is still passing gas this current time.  HPI  Past Medical History:  Diagnosis Date  . Anxiety   . Depression   . Hyperlipidemia   . Hypertension    no meds for hypertension  . Hypothyroidism   . Muscle twitch   . Pain    severe pain left shoulder   s/p left shoulder rotator cuff repair on 01/14/12  . Sleep apnea    prior to gastric bypass 2011- no cpap at present    Patient Active Problem List   Diagnosis Date Noted  . Spinal stenosis of lumbar region with neurogenic claudication 05/15/2017  . Lumbar disc herniation with radiculopathy 05/14/2017  . HTN (hypertension) 03/03/2012  . Dyslipidemia 03/03/2012  . Hypothyroid 03/03/2012  . Obesity 03/03/2012  . Depression 03/03/2012  . Anxiety 03/03/2012  . Cellulitis of shoulder 03/02/2012  . Complete rotator cuff rupture of left shoulder 01/15/2012  . S/P rotator cuff repair 01/15/2012    Past  Surgical History:  Procedure Laterality Date  . BACK SURGERY    . GASTRIC BYPASS    . HEMI-MICRODISCECTOMY LUMBAR LAMINECTOMY LEVEL 1 N/A 05/15/2017   Procedure: Hemilaminectomy lumbar microdisectomy L4-L5 right, decompression;  Surgeon: Ranee Gosselin, MD;  Location: [facility] ORS;  Service: Orthopedics;  Laterality: N/A;  . HERNIA REPAIR     as a child  . SHOULDER OPEN ROTATOR CUFF REPAIR  01/14/2012   Procedure: ROTATOR CUFF REPAIR SHOULDER OPEN;  Surgeon: Jacki Cones, MD;  Location: [facility] ORS;  Service: Orthopedics;  Laterality: Left;  with graft and anchors        Home Medications    Prior to Admission  medications   Medication Sig Start Date End Date Taking? Authorizing Provider  buPROPion (WELLBUTRIN SR) 150 MG 12 hr tablet Take 150 mg by mouth every morning.    [provider]  DULoxetine (CYMBALTA) 60 MG capsule Take 60 mg by mouth 2 (two) times daily. 60    [provider]  escitalopram (LEXAPRO) 5 MG tablet Take 5 mg by mouth every morning.    [provider]  gabapentin (NEURONTIN) 300 MG capsule Take 300 mg by mouth 3 (three) times daily. 05/04/17   [provider]  indapamide (LOZOL) 1.25 MG tablet Take 1.25 mg by mouth daily. 04/24/17   [provider]  levothyroxine (SYNTHROID, LEVOTHROID) 200 MCG tablet Take 200 mcg by mouth daily before breakfast.     [provider]  lisinopril (PRINIVIL,ZESTRIL) 10 MG tablet Take 10 mg by mouth every morning.    [provider]  methocarbamol (ROBAXIN) 500 MG tablet Take 1 tablet (500 mg total) by mouth every 6 (six) hours as needed for muscle spasms. 05/15/17   Constable, Amber, PA-C  metoprolol tartrate (LOPRESSOR) 25 MG tablet Take 25 mg by mouth daily. 04/24/17   [provider]  oxyCODONE-acetaminophen (PERCOCET) 10-325 MG tablet Take 1 tablet by mouth every 4 (four) hours as needed for pain. 05/15/17   Dimitri Ped, PA-C    Family History Family History  Problem Relation Age of Onset  . Diabetes Father     Social History Social History   Tobacco Use  . Smoking status: Current Every Day Smoker    Packs/day: 0.75    Years: 4.00    Pack years: 3.00    Types: Cigarettes    Last attempt to quit: 01/07/2007    Years since quitting: 10.7  . Smokeless tobacco: Never Used  Substance Use Topics  . Alcohol use: Yes    Alcohol/week: 0.0 oz    Comment: 12 pk week  . Drug use: No     Allergies   Penicillins   Review of Systems Review of Systems  All other systems reviewed and are negative.    Physical Exam Updated Vital Signs BP 120/77   Pulse 71    Temp 97.6 F (36.4 C) (Oral)   Resp (!) 21   Ht 6\' 1"  (1.854 m)   Wt 111.6 kg (246 lb)   SpO2 99%   BMI 32.46 kg/m   Physical Exam  Constitutional: He appears well-developed and well-nourished.  HENT:  Head: Normocephalic and atraumatic.  Right Ear: External ear normal.  Left Ear: External ear normal.  Nose: Nose normal.  Mouth/Throat: Uvula is midline, oropharynx is clear and moist and mucous membranes are normal. No tonsillar exudate.  Eyes: Pupils are equal, round, and reactive to light. Right eye exhibits no discharge. Left eye exhibits no discharge. No scleral icterus.  Neck: Trachea normal. Neck supple. No spinous process tenderness present. No neck rigidity. Normal range of motion present.  Cardiovascular: Normal rate, regular rhythm and intact distal pulses.  No murmur heard. Pulses:      Radial pulses are 2+ on the right side, and 2+ on the left side.       Dorsalis pedis pulses are 2+ on the right side, and 2+ on the left side.       Posterior tibial pulses are 2+ on the right side, and 2+ on the left side.  No lower extremity swelling or edema. Calves symmetric in size bilaterally.  Pulmonary/Chest: Effort normal and breath sounds normal. He exhibits no tenderness.  Abdominal: Soft. He exhibits no distension. Bowel sounds are decreased. There is tenderness in the epigastric area and left upper quadrant. There is no rigidity, no rebound, no guarding and no CVA tenderness.  Genitourinary:  Genitourinary Comments: Chaperone was present.  Patient without pain around the rectal area. Perianal sensory intact. No external fissure's palpated or examined. Possible non-thrombosed external hemorrhoids noted. No induration of the skin or swelling. Digital Rectal Exam reveals sphincter with good tone. No masses palpated. Stool color is brown with no overt blood or melena.  Musculoskeletal: He exhibits no edema.  Lymphadenopathy:    He has no cervical adenopathy.  Neurological: He is  alert.  Skin: Skin is warm and dry. No rash noted. He is not diaphoretic.  Psychiatric: He has a normal mood and affect.  Nursing note and vitals reviewed.    ED Treatments / Results  Labs (all labs ordered are listed, but only abnormal results are displayed) Labs Reviewed  COMPREHENSIVE METABOLIC PANEL - Abnormal; Notable for the following components:      Result Value   Glucose, Bld 111 (*)    Total Protein 8.2 (*)    All other components within normal limits  CBC - Abnormal; Notable for the following components:   Hemoglobin 12.7 (*)    HCT 37.6 (*)    RDW 15.7 (*)    All other components within normal limits  I-STAT CHEM 8, ED - Abnormal; Notable for the following components:   Potassium 3.3 (*)    Chloride 98 (*)    Glucose, Bld 103 (*)    Calcium, Ion 1.13 (*)    All other components within normal limits  LIPASE, BLOOD  POC OCCULT BLOOD, ED  TYPE AND SCREEN  ABO/RH    EKG None  Radiology Ct Abdomen Pelvis W Contrast  Result Date: 09/29/2017 CLINICAL DATA:  47 y/o M; gastric bypass 7-8 years ago. Presenting with upper abdominal pain and melena stool. EXAM: CT ABDOMEN AND PELVIS WITH CONTRAST TECHNIQUE: Multidetector CT imaging of the abdomen and pelvis was performed using the standard protocol following bolus administration of intravenous contrast. CONTRAST:  ISOVUE-300 IOPAMIDOL (ISOVUE-300) INJECTION 61% COMPARISON:  06/28/2013 CT abdomen and pelvis FINDINGS: Lower chest: No acute abnormality. Hepatobiliary: No focal liver abnormality is seen. No gallstones, gallbladder wall thickening, or biliary dilatation. Pancreas: Unremarkable. No pancreatic ductal dilatation or surrounding inflammatory changes. Spleen: Normal in size without focal abnormality. Adrenals/Urinary Tract: Adrenal glands are unremarkable. Kidneys are normal, without renal calculi, focal lesion, or hydronephrosis. Bladder is unremarkable. Stomach/Bowel: Gastric bypass with patent anastomoses.  Appendix appears normal. No evidence of bowel wall thickening, distention, or inflammatory changes. Vascular/Lymphatic: Aortic atherosclerosis. No enlarged abdominal or pelvic lymph nodes. Reproductive: Prostate is unremarkable. Other: No abdominal wall hernia or abnormality. No abdominopelvic ascites. Musculoskeletal: L4-5 right-sided  laminectomy postsurgical changes. No acute fracture. IMPRESSION: No acute process identified. Gastric bypass with widely patent anastomoses. Electronically Signed   By: Mitzi Hansen M.D.   On: 09/29/2017 20:26   US Abdomen Limited Ruq  Result Date: 09/29/2017 CLINICAL DATA:  Acute onset of upper abdominal pain. EXAM: ULTRASOUND ABDOMEN LIMITED RIGHT UPPER QUADRANT COMPARISON:  CT of the abdomen and pelvis performed earlier today at 7:56 p.m. FINDINGS: Gallbladder: No gallstones or wall thickening visualized. No sonographic Murphy sign noted by sonographer. Common bile duct: Diameter: 0.6 cm, within normal limits in caliber. Liver: No focal lesion identified. Within normal limits in parenchymal echogenicity. The left hepatic lobe is not fully characterized due to overlying structures. Portal vein is patent on color Doppler imaging with normal direction of blood flow towards the liver. IMPRESSION: Unremarkable ultrasound of the right upper quadrant. Electronically Signed   By: Roanna Raider M.D.   On: 09/29/2017 22:06    Procedures Procedures (including critical care time)  Medications Ordered in ED Medications  iopamidol (ISOVUE-300) 61 % injection (has no administration in time range)  gi cocktail (Maalox,Lidocaine,Donnatal) (has no administration in time range)  sodium chloride 0.9 % bolus 1,000 mL (0 mLs Intravenous Stopped 09/29/17 2207)  ondansetron (ZOFRAN) injection 4 mg (4 mg Intravenous Given 09/29/17 1915)  morphine 4 MG/ML injection 4 mg (4 mg Intravenous Given 09/29/17 1915)  iopamidol (ISOVUE-300) 61 % injection 100 mL (100 mLs Intravenous  Contrast Given 09/29/17 1948)  morphine 4 MG/ML injection 4 mg (4 mg Intravenous Given 09/29/17 2209)     Initial Impression / Assessment and Plan / ED Course  I have reviewed the triage vital signs and the nursing notes.  Pertinent labs & imaging results that were available during my care of the patient were reviewed by me and considered in my medical decision making (see chart for details).     47 y.o. male with a past history of gastric bypass presenting with upper abdominal pain, nausea/vomiting/diarrhea over the last 3 days.  Patient notes that he does have tarry stools but recently was started on oral iron therapy 1 week ago.  Patient's vital signs are reassuring.  Is without fever, tachycardia, tachypnea, hypoxia or hypotension.  He is non-ill and nonseptic appearing.  Patient with upper abdominal pain including left upper quadrant, right upper quadrant and epigastric area.  No obvious Murphy sign.  No peritoneal signs.  Patient's rectal exam without tenderness palpation, gross blood or melena.  Fecal occult blood test negative.  Suspect the patients black stools related to recent iron therapy started by PCP.  Patient without leukocytosis.  Patient with anemia that is up from prior.  This is consistent with patient's reported iron deficiency anemia.  No significant electrolyte derangements.  No evidence of DKA.  LFTs within normal limits.  Evidence of acute kidney injury.  Lipase within normal limits.  CT scan without any acute process identified.  No evidence of obstruction.  Gastric bypass with patent anastomosis.  Right upper quadrant ultrasound without evidence of cholelithiasis or cholecystitis.  EKG with sinus rhythm.  Patient given IV fluids and pain medication in the department.  He is tolerating p.o. fluids and without emesis while in the department. The evaluation does not show pathology that would require ongoing emergent intervention or inpatient treatment. I advised the patient to  follow-up with PCP this week. Also recommended that patient follow up with [facility]. I advised the patient to return to the emergency department with new or worsening symptoms or  new concerns. Specific return precautions discussed. The patient verbalized understanding and agreement with plan. All questions answered. No further questions at this time. The patient is hemodynamically stable, mentating appropriately and appears safe for discharge.  Final Clinical Impressions(s) / ED Diagnoses   Final diagnoses:  Upper abdominal pain  History of gastric bypass  Nausea vomiting and diarrhea    ED Discharge Orders        Ordered    omeprazole (PRILOSEC) 20 MG capsule  Daily     09/29/17 2238    ondansetron (ZOFRAN) 4 MG tablet  Every 8 hours PRN     09/29/17 2238       Princella PellegriniMaczis, Draedyn Weidinger M, PA-C 09/29/17 2240    Alvira MondaySchlossman, Erin, MD 09/30/17 1407

## 2017-09-30 LAB — ABO/RH: ABO/RH(D): A POS

## 2017-10-09 ENCOUNTER — Other Ambulatory Visit: Payer: Self-pay

## 2017-10-09 NOTE — Patient Outreach (Signed)
[facility]) Care Management  10/09/2017  Thad RangerLawrence Mauney 1971/02/06 [id number]  TELEPHONE SCREENING Referral date: 10/01/17 [facility] Referral reason: medication assistance Insurance: health team advantage Attempt #1  Telephone call to patient regarding utilization management referral. Unable to reach patient. HIPAA compliant voice message left with call back phone number.   PLAN: RNCM will attempt 2nd telephone call to patient within 4 business days.  RNCM will send outreach letter to attempt contact.   George InaDavina Madelyn Tlatelpa RN,BSN,CCM [facility] Telephonic  [phone]

## 2017-10-14 ENCOUNTER — Other Ambulatory Visit: Payer: Self-pay

## 2017-10-14 NOTE — Patient Outreach (Signed)
[facility]) Care Management  10/14/2017  Victor Oconnor [birth year] [id number]  TELEPHONE SCREENING Referral date: 10/01/17 Referral source: [facility] utilization management Referral reason: difficulty affording bariatric vitamins Insurance: Health team advantage   Telephone call to patient regarding utllization management referral. HIPAA verified with patient. Explained reason for call. Patient states he is not able to afford his bariatric vitamins.  Would like to see if he can get assistance.  Patient reports the vitamins are: calcium, iron, B-12 and multivitamins.   Patient states he also deals with anxiety and depression. Patient states he is taking medication for depression.  States he does not see a Veterinary surgeon but would like to . Patient states he gets panic attack at night when he is sleeping.  States this affects his sleep quite a bit.  Patient states he is working on getting a new primary care provider.  States he would like to go back to his previous provider, Dr. Shary Decamp. States he is working on this.  Discussed and offered [facility] care management services to patient. Patient verbally agreed to services with social worker and pharmacist.  PLAN: RNCM will refer patient to social worker and pharmacist.   George Ina RN,BSN,CCM [facility] Telephonic  [phone]

## 2017-10-16 ENCOUNTER — Other Ambulatory Visit: Payer: Self-pay | Admitting: Pharmacist

## 2017-10-16 ENCOUNTER — Other Ambulatory Visit: Payer: Self-pay | Admitting: *Deleted

## 2017-10-16 ENCOUNTER — Encounter: Payer: Self-pay | Admitting: *Deleted

## 2017-10-16 NOTE — Patient Outreach (Signed)
[facility]) Care Management  10/16/2017  Victor Oconnor 30-Jun-1970 [id number]  47 y.o. year old male referred to [facility] pharmacy for Medication Assistance (Initial pharmacy consult- bariatric vitamins) Referred by [facility] for medication assistance with bariatric vitamins (calcium, iron, B12, and multivitamin)  Patient has [employer] Medicare advantage plan.   Was able to reach patient via telephone today but unfortunately he did not have time to speak with me and asked that I call back later. (unsuccessful outreach #1).  Assessment:  Unfortunately, Medicare Part D does not cover OTCs or vitamins. Additionally, [employer] does not have an OTC benefit currently.   Plan: Will follow-up within 2-4  business days via telephone.   Sherlyn Hay, PharmD Clinical Pharmacist, [facility] [phone]

## 2017-10-16 NOTE — Patient Outreach (Signed)
[facility]) Care Management  10/16/2017  Victor Oconnor Aug 29, 1970 [id number]   CSW made an initial attempt to try and contact patient today to perform the initial phone assessment, as well as assess and assist with social work needs and services, without success.  A HIPAA compliant message was left for patient on voicemail.  CSW is currently awaiting a return call.  CSW will make a second outreach attempt within the next 3-4 business days, if a return call is not received from patient in the meantime.  CSW will also mail an Outreach Letter to patient's home requesting that patient contact CSW if patient is interested in receiving social work services through CSW with Chief Executive Officer. Danford Bad, BSW, MSW, LCSW  Licensed Restaurant manager, [employer] [facility]  Mailing [place] [address] [place], [place] [address] Physical Address-[address], [place], [place] [address] Toll Free Main # [phone] Fax # [phone] Cell # [phone]  Office # [phone] [email or web address]

## 2017-10-19 ENCOUNTER — Other Ambulatory Visit: Payer: Self-pay | Admitting: *Deleted

## 2017-10-19 ENCOUNTER — Other Ambulatory Visit: Payer: Self-pay | Admitting: Pharmacist

## 2017-10-19 ENCOUNTER — Encounter: Payer: Self-pay | Admitting: Pharmacist

## 2017-10-19 NOTE — Patient Outreach (Signed)
[facility]) Care Management  10/19/2017  Victor Oconnor 03-07-1971 [id number]   CSW made phone outreach call today. CSW confirmed pt's identity and introduced self and role with [facility]. Pt reports he is driving and asks if he can call CSW back.   CSW awaiting call back. If no return call is received in the next 3 days, CSW will outreach again.   [name], MSW, LCSW Clinical Social Worker  [facility] [phone]

## 2017-10-19 NOTE — Patient Outreach (Signed)
[facility]) Care Management  [facility]   10/19/2017  Victor Oconnor May 01, 1971 [id number]  47 y.o. year old male referred to [facility] pharmacy for Medication Assistance [facility] pharmacy consult- bariatric vitamins) Referred by [facility] for medication assistance with bariatric vitamins (calcium, iron, B12, multivitamin)  PMH s/f: hypertension, hypothyroidism, depression/anxiety, lumbar disc herniation  Patient with [employer] Medicare Advantage plan.   Patient confirms identity with HIPAA-identifiers x2 and gives verbal consent to speak over the phone about medications.    Subjective:  Medication Adherence: Patient is out of most of his medications today because he cannot afford copays.   Medication Assistance:  Patient cannot afford medications or OTC vitamins needed as he is s/p bariatric surgery. Unfortunately, Medicare Part D does not cover OTCs or vitamins. Additionally, [employer] does not have an OTC benefit currently. Further, patient is on generic medications with [employer], which does not have a mail order pharmacy service that could provide 90 day supply of generic medications with $0 copays. Patient states his copays are currently between $3 and $5.   Medication Management:  Manages medications himself. Does not use a pill box. Typically keeps his medications out on a table but states today "the house is a mess" and the medications are scattered in different places.   Objective:   Current Medications: Current Outpatient Medications  Medication Sig Dispense Refill  . levothyroxine (SYNTHROID, LEVOTHROID) 200 MCG tablet Take 200 mcg by mouth daily before breakfast.     . oxyCODONE-acetaminophen (PERCOCET) 10-325 MG tablet Take 1 tablet by mouth every 4 (four) hours as needed for pain. 60 tablet 0  . DULoxetine (CYMBALTA) 60 MG capsule Take 60 mg by mouth 2 (two) times daily. 60    . hydrOXYzine (ATARAX/VISTARIL) 25 MG  tablet Take 1 tablet (25 mg total) by mouth every 6 (six) hours. (Patient not taking: Reported on 10/19/2017) 12 tablet 0  . indapamide (LOZOL) 1.25 MG tablet Take 1.25 mg by mouth daily.    Marland Kitchen lisinopril (PRINIVIL,ZESTRIL) 10 MG tablet Take 10 mg by mouth every morning.    . metoprolol tartrate (LOPRESSOR) 25 MG tablet Take 25 mg by mouth daily.    Marland Kitchen omeprazole (PRILOSEC) 20 MG capsule Take 1 capsule (20 mg total) by mouth daily. (Patient not taking: Reported on 10/19/2017) 30 capsule 0  . ondansetron (ZOFRAN) 4 MG tablet Take 1 tablet (4 mg total) by mouth every 8 (eight) hours as needed for nausea or vomiting. (Patient not taking: Reported on 10/19/2017) 4 tablet 0   No current facility-administered medications for this visit.     Functional Status: In your present state of health, do you have any difficulty performing the following activities: 05/14/2017 05/14/2017  Hearing? - N  Vision? - N  Difficulty concentrating or making decisions? - N  Walking or climbing stairs? - Y  Dressing or bathing? - N  Doing errands, shopping? N -  Some recent data might be hidden    Fall/Depression Screening: No flowsheet data found. PHQ 2/9 Scores 10/14/2017  PHQ - 2 Score 5  PHQ- 9 Score 19    Assessment:  Drugs sorted by system:  Neurologic/Psychologic: duloxetine  Cardiovascular: indapamide, lisinopril, metoprolol  Endocrine: levothyroxine  Pain: Percocet  Vitamins/Minerals: calcium, iron, B12, MVI  Miscellaneous: hydroxyzine  Gaps in therapy: unable to be adherent to medications Drug interactions: none of clinical significance  Plan: 1. Completed medication review and updated medication list in chart. Updated patient's preferred pharmacy. Unfortunately as patient  is unable to afford his medication, he has extremely low adherence.  2. Assisted the patient in applying for Medicare Extra Help (LIS). Will await approval letter via mail.  3. Recommend to LCSW [name] the patient  could use financial counseling and Medicaid application.  4. Follow up in 3-5 business days to assess any other local programs that could assist patient with medications until Extra Help application is approved.   Sherlyn Hay, PharmD Clinical Pharmacist, [facility] [phone]

## 2017-10-21 ENCOUNTER — Other Ambulatory Visit: Payer: Self-pay | Admitting: Pharmacist

## 2017-10-21 NOTE — Patient Outreach (Signed)
[facility]) Care Management  10/21/2017  Victor Oconnor February 25, 1971 [id number]   47 y.o. year old male referred to [facility] pharmacy for Medication Assistance [facility] pharmacy consult- bariatric vitamins) Referred by [facility] for medication assistance with bariatric vitamins (calcium, iron, B12, multivitamin)  PMH s/f: hypertension, hypothyroidism, depression/anxiety, lumbar disc herniation  Patient with [employer] Medicare Advantage plan.   Patient confirms identity with HIPAA-identifiers x2 and gives verbal consent to speak over the phone about medications.    Assessment:  Follow up from yesterday's initial med review to see if patient could qualify for any MAPs. There is a program by [employer] for Cymbalta (duloxetine). However, there is an eligibility requirement of  minimum out of pocket spend on prescription costs of $1100. Patient states he has spent about $200 year to date, as he just received this statement in the mail.   Plan:  1. Unfortunately, there are no known MAPs available to this patient. We will have to await approval through Medicare Extra Help (LIS).  2. Will alert [name], [facility] LCSW, to review financial assessment with the patient. Patient also requested that Marylu Lund assist him in finding a counselor/therapist. Patient will be awaiting her phone call tomorrow.  3. Follow up in 5 business days regarding status of LIS.   Sherlyn Hay, PharmD Clinical Pharmacist, [facility] [phone]

## 2017-10-22 ENCOUNTER — Other Ambulatory Visit: Payer: Self-pay | Admitting: *Deleted

## 2017-10-22 NOTE — Patient Outreach (Signed)
[facility]  [facility] Social Work  10/22/2017  Victor Oconnor 10/16/1970 [id number]  Subjective:  "I am doing better but still depressed"   Objective: [facility] CSW to assist patient and family with community based resources to aide in their well-being, quality of life and overall safety and needs.    Encounter Medications:  Outpatient Encounter Medications as of 10/22/2017  Medication Sig Note  . DULoxetine (CYMBALTA) 60 MG capsule Take 60 mg by mouth 2 (two) times daily. 60 10/19/2017: Out of medication, needs refills  . hydrOXYzine (ATARAX/VISTARIL) 25 MG tablet Take 1 tablet (25 mg total) by mouth every 6 (six) hours. (Patient not taking: Reported on 10/19/2017)   . indapamide (LOZOL) 1.25 MG tablet Take 1.25 mg by mouth daily. 10/19/2017: Need refill   . levothyroxine (SYNTHROID, LEVOTHROID) 200 MCG tablet Take 200 mcg by mouth daily before breakfast.    . lisinopril (PRINIVIL,ZESTRIL) 10 MG tablet Take 10 mg by mouth every morning.   . metoprolol tartrate (LOPRESSOR) 25 MG tablet Take 25 mg by mouth daily. 10/19/2017: Out of medication, cannot afford   . omeprazole (PRILOSEC) 20 MG capsule Take 1 capsule (20 mg total) by mouth daily. (Patient not taking: Reported on 10/19/2017)   . ondansetron (ZOFRAN) 4 MG tablet Take 1 tablet (4 mg total) by mouth every 8 (eight) hours as needed for nausea or vomiting. (Patient not taking: Reported on 10/19/2017)   . oxyCODONE-acetaminophen (PERCOCET) 10-325 MG tablet Take 1 tablet by mouth every 4 (four) hours as needed for pain.    No facility-administered encounter medications on file as of 10/22/2017.     Functional Status:  In your present state of health, do you have any difficulty performing the following activities: 05/14/2017 05/14/2017  Hearing? - N  Vision? - N  Difficulty concentrating or making decisions? - N  Walking or climbing stairs? - Y  Dressing or bathing? - N  Doing errands, shopping? N -  Some recent data  might be hidden    Fall/Depression Screening:  PHQ 2/9 Scores 10/22/2017 10/14/2017  PHQ - 2 Score 4 5  PHQ- 9 Score 19 19     Assessment:  CSW spoke with patient by phone today and was able to confirm his identity and discuss reason for call.  He admits to having depression and anxiety that are poorly controlled.  He shared with CSW that he takes Cymbalta for depression.  He also shared a history of suicide attempt (drug overdose) but denies any SI, HI at this time.  "I am a christian and that helps me to not be" (suicidal). He also admits to symptoms related to his depression including low energy, poor appetite, difficulty sleeping, pain and overall sadness. He is open to considering a Psychiatry eval and mental health counseling and CSW will pursue this in network options for him.   Plan:  .  [facility] CM Care Plan Problem One     Most Recent Value  Care Plan Problem One  Pt reports struggling with depression.  Role Documenting the Problem One  Clinical Social Worker  Care Plan for Problem One  Active  THN Long Term Goal   Pt will report improvement in his depression/PHQ9 score in the next 40 days.   THN Long Term Goal Start Date  10/22/17  Interventions for Problem One Long Term Goal  CSW assessed and discussed his signs/symptoms and level of  depression. Pt agrees to a Psychaitrist evaluation and counseling.   TH[facility] CM  Short Term Goal #1   Pt will report receipt of in network providers for Psychiatry eval and mental health counsleors.   TH[facility] CM Short Term Goal #1 Start Date  10/22/17  Interventions for Short Term Goal #1  CSW providing pt with [place] providers in his county for appointment.   THN CM Short Term Goal #2   Pt will report MD has discussed and altered medication treatment plan for depression in the next 30 days.   [facility] CM Short Term Goal #2 Start Date  10/22/17  Interventions for Short Term Goal #2  CSW encouraged pt to discuss current RX plan and possible changes to be made to better  manage pt's depression.      [name], MSW, LCSW Clinical Social Worker  [facility] [phone]

## 2017-10-28 ENCOUNTER — Other Ambulatory Visit: Payer: Self-pay | Admitting: Pharmacist

## 2017-10-28 ENCOUNTER — Ambulatory Visit: Payer: Self-pay | Admitting: Pharmacist

## 2017-10-28 NOTE — Patient Outreach (Signed)
[facility]) Care Management  10/28/2017  Jaryn Rosko Mar 10, 1971 [id number]  47 y.o. year old male referred to [facility] pharmacy for Medication Assistance (LIS follow up)   Was unable to reach patient via telephone today and have left HIPAA compliant voicemail asking patient to return my call. (unsuccessful outreach #1).  Plan: Will follow-up within 2-4 business days via telephone.   Sherlyn Hay, PharmD Clinical Pharmacist, [facility] [phone]

## 2017-10-30 ENCOUNTER — Other Ambulatory Visit: Payer: Self-pay | Admitting: Pharmacist

## 2017-10-30 NOTE — Patient Outreach (Signed)
[facility]) Care Management  10/30/2017  Victor Oconnor Sep 18, 1970 [id number]  47 y.o. year old male referred to [facility] pharmacy for Medication Assistance (LIS follow up)  47 y.o. year old male referred to [facility] pharmacy for Medication Assistance (LIS follow up)  Patient with [employer] Medicare Advantage plan.   Patient confirms identity with HIPAA-identifiers x2 and gives verbal consent to speak over the phone about medications.   Medication Assistance:  Patient states he has not received an approval/denial letter from [facility] Extra Help application (filed 10/19/2017). He did not have his original Medicare card (Parts A and B or "red, white, and blue card") with him for me to perform the online check.   Plan: Will follow up in 5-10 business days regarding status of LIS.   Sherlyn Hay, PharmD Clinical Pharmacist, [facility] [phone]

## 2017-11-03 ENCOUNTER — Other Ambulatory Visit: Payer: Self-pay | Admitting: *Deleted

## 2017-11-03 NOTE — Patient Outreach (Signed)
[facility]) Care Management  11/03/2017  Wai Litt Aug 03, 1970 [id number]   CSW spoke with pt today by phone and have assisted with appointment plans for Psychiatry assessment.   TH[facility] CM Care Plan Problem One     Most Recent Value  Care Plan Problem One  Pt reports struggling with depression.  Role Documenting the Problem One  Clinical Social Worker  Care Plan for Problem One  Active  THN Long Term Goal   Pt will report improvement in his depression/PHQ9 score in the next 40 days.   THN Long Term Goal Start Date  10/22/17  Interventions for Problem One Long Term Goal  Pt reports some improvement in his feelings of depression,  ranking it a "5" out of 10.  CSW assisting pt with appointment to be reassessed by Psychiatry.   THN CM Short Term Goal #1   Pt will report receipt of in network providers for Psychiatry eval and mental health counsleors.   [facility] CM Short Term Goal #1 Start Date  10/22/17  [facility] CM Short Term Goal #1 Met Date  11/03/17  Interventions for Short Term Goal #1  Pt agrees to f/u at [place].   THN CM Short Term Goal #2   Pt will report MD has discussed and altered medication treatment plan for depression in the next 30 days.   [facility] CM Short Term Goal #2 Start Date  10/22/17  Interventions for Short Term Goal #2  Pt to be seen on 12/02/17 by Psychiatry and will take list of all RX's.       CSW will plan to mail info to pt regarding appointment and plan f/u call to remind pt and check in the week of his 12/02/17 appointment.   Eduard Clos, MSW, [place] Worker  [place] [phone]

## 2017-11-04 DIAGNOSIS — G894 Chronic pain syndrome: Secondary | ICD-10-CM | POA: Diagnosis not present

## 2017-11-06 ENCOUNTER — Other Ambulatory Visit: Payer: Self-pay | Admitting: Pharmacist

## 2017-11-06 ENCOUNTER — Ambulatory Visit: Payer: Self-pay | Admitting: Pharmacist

## 2017-11-06 NOTE — Patient Outreach (Signed)
[facility]) Care Management  11/06/2017  Victor Oconnor Oct 29, 1970 [id number]  48 y.o. year old male referred to [facility] pharmacy for Medication Assistance (LIS follow up)   Was unable to reach patient via telephone today and have left HIPAA compliant voicemail asking patient to return my call. (unsuccessful outreach #1).  Plan: Will follow-up within 2-4  business days via telephone.   Karalee Height, PharmD Clinical Pharmacist, [facility] [phone]

## 2017-11-10 ENCOUNTER — Other Ambulatory Visit: Payer: Self-pay | Admitting: Pharmacist

## 2017-11-10 NOTE — Patient Outreach (Signed)
[facility]) Care Management  11/10/2017  Victor Oconnor 1970/12/15 [id number]    47 y.o. year old male referred to [facility] pharmacy for Medication Assistance (LIS follow up) Referred by [facility] for medication assistance with bariatric vitamins (calcium, iron, B12, multivitamin).  Patient with [employer] Medicare Advantage plan.   Patient confirms identity with HIPAA-identifiers x2 and gives verbal consent to speak over the phone about medications.   Assessment: Patient states he has received a letter from [facility] that he already had Medicare Extra Help to assist with prescription copays. He has "Full Extra Help" status.   As previously assessed, patient does not currently qualify for any private drug company MAP. There is a program by [employer] for Cymbalta (duloxetine). However, there is an eligibility requirement of minimum out of pocket spend on prescription costs of $1100. Patient states he has spent about $200 year to date, as he just received this statement in the mail in early May.   Plan: Unfortunately, we have exhausted all pharmacy copay assistance options for Victor Oconnor. He has my contact information and states he will contact [facility] clinical pharmacy services once he has spent closer to $1000 on out of pocket prescription costs. Will close pharmacy episode at this time.   Karalee Height, PharmD Clinical Pharmacist, [facility] [phone]

## 2017-11-23 DIAGNOSIS — D509 Iron deficiency anemia, unspecified: Secondary | ICD-10-CM | POA: Diagnosis not present

## 2017-11-23 DIAGNOSIS — G44209 Tension-type headache, unspecified, not intractable: Secondary | ICD-10-CM | POA: Diagnosis not present

## 2017-11-23 DIAGNOSIS — I10 Essential (primary) hypertension: Secondary | ICD-10-CM | POA: Diagnosis not present

## 2017-11-23 DIAGNOSIS — F419 Anxiety disorder, unspecified: Secondary | ICD-10-CM | POA: Diagnosis not present

## 2017-11-23 DIAGNOSIS — E039 Hypothyroidism, unspecified: Secondary | ICD-10-CM | POA: Diagnosis not present

## 2017-11-30 ENCOUNTER — Other Ambulatory Visit: Payer: Self-pay | Admitting: *Deleted

## 2017-11-30 NOTE — Patient Outreach (Signed)
[place] [facility]) Care Management  [facility] Social Work  11/30/2017  Victor Oconnor 09-10-1970 [id number]  Subjective:  "I am doing well, Victor Oconnor"  Objective: [facility] CSW to assist patient and family with community based resources to aide in their well-being, quality of life and overall safety and needs.    Encounter Medications:  Outpatient Encounter Medications as of 11/30/2017  Medication Sig Note  . DULoxetine (CYMBALTA) 60 MG capsule Take 60 mg by mouth 2 (two) times daily. 60 10/19/2017: Out of medication, needs refills  . hydrOXYzine (ATARAX/VISTARIL) 25 MG tablet Take 1 tablet (25 mg total) by mouth every 6 (six) hours. (Patient not taking: Reported on 10/19/2017)   . indapamide (LOZOL) 1.25 MG tablet Take 1.25 mg by mouth daily. 10/19/2017: Need refill   . levothyroxine (SYNTHROID, LEVOTHROID) 200 MCG tablet Take 200 mcg by mouth daily before breakfast.    . lisinopril (PRINIVIL,ZESTRIL) 10 MG tablet Take 10 mg by mouth every morning.   . metoprolol tartrate (LOPRESSOR) 25 MG tablet Take 25 mg by mouth daily. 10/19/2017: Out of medication, cannot afford   . omeprazole (PRILOSEC) 20 MG capsule Take 1 capsule (20 mg total) by mouth daily. (Patient not taking: Reported on 10/19/2017)   . ondansetron (ZOFRAN) 4 MG tablet Take 1 tablet (4 mg total) by mouth every 8 (eight) hours as needed for nausea or vomiting. (Patient not taking: Reported on 10/19/2017)   . oxyCODONE-acetaminophen (PERCOCET) 10-325 MG tablet Take 1 tablet by mouth every 4 (four) hours as needed for pain.    No facility-administered encounter medications on file as of 11/30/2017.     Functional Status:  In your present state of health, do you have any difficulty performing the following activities: 05/14/2017 05/14/2017  Hearing? - N  Vision? - N  Difficulty concentrating or making decisions? - N  Walking or climbing stairs? - Y  Dressing or bathing? - N  Doing errands, shopping? N -  Some recent data might be hidden     Fall/Depression Screening:  PHQ 2/9 Scores 10/22/2017 10/14/2017  PHQ - 2 Score 4 5  PHQ- 9 Score 19 19    Assessment:  CSW contacted pt today to follow up and to remind him of his appointment on Wednesday for mental health assessment/support.  Pt remembered the date and had the time wrong- CSW called the practice and did ensure the time and called pt to confirm. He was appreciative of call and reminder.  Patient reports some hip pain and plans to see MD for this- states many of his family members have had hip replacements and thus feels it is hereditary.   Plan:  [facility] CM Care Plan Problem One     Most Recent Value  Care Plan Problem One  Pt reports struggling with depression.  Role Documenting the Problem One  Clinical Social Worker  Care Plan for Problem One  Active  [facility]N Long Term Goal   Pt will report improvement in his depression/PHQ9 score in the next 40 days.   THN Long Term Goal Start Date  10/22/17  THN Long Term Goal Met Date  11/30/17  Interventions for Problem One Long Term Goal  Patient reports his depression has decreased and he is planning to go to [facility] clinic later this week.   THN CM Short Term Goal #1      [facility] CM Short Term Goal #2   Pt will report MD has discussed and altered medication treatment plan for depression in the next 30  days.   [facility] CM Short Term Goal #2 Start Date  10/22/17  [facility] CM Short Term Goal #2 Met Date  11/30/17  Interventions for Short Term Goal #2  Patient plans to see mentalhealth therapist 12/02/17 and proceed with RX needs at that time.         Pt will plan f/u call in the next week for update on [facility] visit.   Eduard Clos, MSW, [place] Worker  [place] [phone]

## 2017-12-04 ENCOUNTER — Other Ambulatory Visit: Payer: Self-pay | Admitting: *Deleted

## 2017-12-04 ENCOUNTER — Ambulatory Visit: Payer: Self-pay | Admitting: *Deleted

## 2017-12-04 NOTE — Patient Outreach (Signed)
[facility]) Care Management  12/04/2017  Victor RangerLawrence Oconnor [birth year] [id number]   CSW attempted to reach pt by phone today but was unsuccessful- message left for return call for updates. Pt was scheduled to see a new mental health therapists today and await callback from pt. CSW will call pt again next week if no return call is received.   Reece LevyJanet Elvert Cumpton, MSW, LCSW Clinical Social Worker  [facility] [phone]

## 2017-12-09 ENCOUNTER — Other Ambulatory Visit: Payer: Self-pay | Admitting: *Deleted

## 2017-12-09 NOTE — Patient Outreach (Signed)
[facility]) Care Management  [facility] Social Work  12/09/2017  Victor Oconnor [birth year] [id number]  Subjective:  "Doing fine"  Objective: [facility] CSW to assist patient and family with community based resources to aide in their well-being, quality of life and overall safety and needs.    Encounter Medications:  Outpatient Encounter Medications as of 12/09/2017  Medication Sig Note  . DULoxetine (CYMBALTA) 60 MG capsule Take 60 mg by mouth 2 (two) times daily. 60 10/19/2017: Out of medication, needs refills  . hydrOXYzine (ATARAX/VISTARIL) 25 MG tablet Take 1 tablet (25 mg total) by mouth every 6 (six) hours. (Patient not taking: Reported on 10/19/2017)   . indapamide (LOZOL) 1.25 MG tablet Take 1.25 mg by mouth daily. 10/19/2017: Need refill   . levothyroxine (SYNTHROID, LEVOTHROID) 200 MCG tablet Take 200 mcg by mouth daily before breakfast.    . lisinopril (PRINIVIL,ZESTRIL) 10 MG tablet Take 10 mg by mouth every morning.   . metoprolol tartrate (LOPRESSOR) 25 MG tablet Take 25 mg by mouth daily. 10/19/2017: Out of medication, cannot afford   . omeprazole (PRILOSEC) 20 MG capsule Take 1 capsule (20 mg total) by mouth daily. (Patient not taking: Reported on 10/19/2017)   . ondansetron (ZOFRAN) 4 MG tablet Take 1 tablet (4 mg total) by mouth every 8 (eight) hours as needed for nausea or vomiting. (Patient not taking: Reported on 10/19/2017)   . oxyCODONE-acetaminophen (PERCOCET) 10-325 MG tablet Take 1 tablet by mouth every 4 (four) hours as needed for pain.    No facility-administered encounter medications on file as of 12/09/2017.     Functional Status:  In your present state of health, do you have any difficulty performing the following activities: 05/14/2017 05/14/2017  Hearing? - N  Vision? - N  Difficulty concentrating or making decisions? - N  Walking or climbing stairs? - Y  Dressing or bathing? - N  Doing errands, shopping? N -  Some recent data might be hidden     Fall/Depression Screening:  PHQ 2/9 Scores 10/22/2017 10/14/2017  PHQ - 2 Score 4 5  PHQ- 9 Score 19 19    Assessment: Pt reports he went to his appointment with mental health provider and states it went well. "I go back on July 2nd".    Plan:  [facility] CM Care Plan Problem One     Most Recent Value  Care Plan Problem One  Pt reports struggling with depression.  Role Documenting the Problem One  Clinical Social Worker  Care Plan for Problem One  Active  THN Long Term Goal   Pt will report improvement in his depression/PHQ9 score in the next 40 days.   THN Long Term Goal Start Date  10/22/17  THN Long Term Goal Met Date  11/30/17  [facility] CM Short Term Goal #1   Pt will report attending therapy appointment in the next 30 days  THN CM Short Term Goal #1 Start Date  12/09/17  Interventions for Short Term Goal #1  Pt reports plans to see therapist for 2nd visit on 12/15/2017. CSW encouraged pt to plan on-going sessions with therapist.   [facility] CM Short Term Goal #2   Patient will report update on RX treatment for his anti-depressants in the next 30 days.     [facility] CM Short Term Goal #2 Start Date  12/09/17  Interventions for Short Term Goal #2  Patient to discuss with provider.       CSW will plan call for updates and further planning/goals  in 1-2 weeks.   Eduard Clos, MSW, [place] Worker  [place] [phone]

## 2017-12-22 ENCOUNTER — Ambulatory Visit: Payer: Self-pay | Admitting: *Deleted

## 2017-12-24 ENCOUNTER — Ambulatory Visit: Payer: Self-pay | Admitting: *Deleted

## 2017-12-24 ENCOUNTER — Other Ambulatory Visit: Payer: Self-pay | Admitting: *Deleted

## 2017-12-24 ENCOUNTER — Encounter: Payer: Self-pay | Admitting: *Deleted

## 2017-12-24 NOTE — Patient Outreach (Signed)
[facility]N) Care Management  12/24/2017  Thad RangerLawrence Asbridge March 02, 1971 [id number]   CSW was unable to reach pt today by phone for follow up. CSW left a HIPPA compliant voice message for pt and will await call back or try again next week.  CSW plans to send pt an unsuccessful outreach letter.    Reece LevyJanet Zandon Talton, MSW, LCSW Clinical Social Worker  [facility] [phone]

## 2017-12-28 DIAGNOSIS — M5412 Radiculopathy, cervical region: Secondary | ICD-10-CM | POA: Diagnosis not present

## 2017-12-29 ENCOUNTER — Other Ambulatory Visit: Payer: Self-pay | Admitting: *Deleted

## 2017-12-29 ENCOUNTER — Ambulatory Visit: Payer: Self-pay | Admitting: *Deleted

## 2017-12-29 NOTE — Patient Outreach (Signed)
[facility]) Care Management  12/29/2017  Victor Oconnor Oct 26, 1970 [id number]   CSW has been unable to reach pt and have received no return calls from voice messages or letter mailed to pt.  Pt has left a final voice message today and advised pt of plans to close referral at this time.  [facility] and [facility]CP of above.   Reece LevyJanet Allysa Governale, MSW, LCSW Clinical Social Worker  [facility] [phone]

## 2018-02-04 DIAGNOSIS — M25522 Pain in left elbow: Secondary | ICD-10-CM | POA: Diagnosis not present

## 2018-02-04 DIAGNOSIS — G894 Chronic pain syndrome: Secondary | ICD-10-CM | POA: Diagnosis not present

## 2018-02-04 DIAGNOSIS — Z79891 Long term (current) use of opiate analgesic: Secondary | ICD-10-CM | POA: Diagnosis not present

## 2018-02-04 DIAGNOSIS — M545 Low back pain: Secondary | ICD-10-CM | POA: Diagnosis not present

## 2018-05-06 DIAGNOSIS — Z5181 Encounter for therapeutic drug level monitoring: Secondary | ICD-10-CM | POA: Diagnosis not present

## 2018-05-06 DIAGNOSIS — M545 Low back pain: Secondary | ICD-10-CM | POA: Diagnosis not present

## 2018-05-06 DIAGNOSIS — M25519 Pain in unspecified shoulder: Secondary | ICD-10-CM | POA: Diagnosis not present

## 2018-05-06 DIAGNOSIS — Z79899 Other long term (current) drug therapy: Secondary | ICD-10-CM | POA: Diagnosis not present

## 2018-05-24 ENCOUNTER — Encounter (HOSPITAL_COMMUNITY): Payer: Self-pay

## 2018-08-03 DIAGNOSIS — R7303 Prediabetes: Secondary | ICD-10-CM | POA: Diagnosis not present

## 2018-08-03 DIAGNOSIS — R5383 Other fatigue: Secondary | ICD-10-CM | POA: Diagnosis not present

## 2018-08-03 DIAGNOSIS — D509 Iron deficiency anemia, unspecified: Secondary | ICD-10-CM | POA: Diagnosis not present

## 2018-08-03 DIAGNOSIS — F330 Major depressive disorder, recurrent, mild: Secondary | ICD-10-CM | POA: Diagnosis not present

## 2018-08-03 DIAGNOSIS — I10 Essential (primary) hypertension: Secondary | ICD-10-CM | POA: Diagnosis not present

## 2018-08-03 DIAGNOSIS — R5381 Other malaise: Secondary | ICD-10-CM | POA: Diagnosis not present

## 2018-08-03 DIAGNOSIS — E785 Hyperlipidemia, unspecified: Secondary | ICD-10-CM | POA: Diagnosis not present

## 2018-08-03 DIAGNOSIS — G894 Chronic pain syndrome: Secondary | ICD-10-CM | POA: Diagnosis not present

## 2018-08-03 DIAGNOSIS — E039 Hypothyroidism, unspecified: Secondary | ICD-10-CM | POA: Diagnosis not present

## 2018-08-25 DIAGNOSIS — M21622 Bunionette of left foot: Secondary | ICD-10-CM | POA: Diagnosis not present

## 2018-08-25 DIAGNOSIS — M2012 Hallux valgus (acquired), left foot: Secondary | ICD-10-CM | POA: Diagnosis not present

## 2018-08-25 DIAGNOSIS — M2042 Other hammer toe(s) (acquired), left foot: Secondary | ICD-10-CM | POA: Diagnosis not present

## 2018-09-08 DIAGNOSIS — J302 Other seasonal allergic rhinitis: Secondary | ICD-10-CM | POA: Diagnosis not present

## 2018-09-30 DIAGNOSIS — Z23 Encounter for immunization: Secondary | ICD-10-CM | POA: Diagnosis not present

## 2018-09-30 DIAGNOSIS — R6884 Jaw pain: Secondary | ICD-10-CM | POA: Diagnosis not present

## 2018-09-30 DIAGNOSIS — M545 Low back pain: Secondary | ICD-10-CM | POA: Diagnosis not present

## 2018-09-30 DIAGNOSIS — E118 Type 2 diabetes mellitus with unspecified complications: Secondary | ICD-10-CM | POA: Diagnosis not present

## 2018-09-30 DIAGNOSIS — R6883 Chills (without fever): Secondary | ICD-10-CM | POA: Diagnosis not present

## 2018-09-30 DIAGNOSIS — R5383 Other fatigue: Secondary | ICD-10-CM | POA: Diagnosis not present

## 2018-09-30 DIAGNOSIS — D649 Anemia, unspecified: Secondary | ICD-10-CM | POA: Diagnosis not present

## 2018-09-30 DIAGNOSIS — Z9889 Other specified postprocedural states: Secondary | ICD-10-CM | POA: Diagnosis not present

## 2018-09-30 DIAGNOSIS — R350 Frequency of micturition: Secondary | ICD-10-CM | POA: Diagnosis not present

## 2018-09-30 DIAGNOSIS — Z79891 Long term (current) use of opiate analgesic: Secondary | ICD-10-CM | POA: Diagnosis not present

## 2018-09-30 DIAGNOSIS — M5136 Other intervertebral disc degeneration, lumbar region: Secondary | ICD-10-CM | POA: Diagnosis not present

## 2018-09-30 DIAGNOSIS — G894 Chronic pain syndrome: Secondary | ICD-10-CM | POA: Diagnosis not present

## 2019-01-31 DIAGNOSIS — M5417 Radiculopathy, lumbosacral region: Secondary | ICD-10-CM | POA: Diagnosis not present

## 2019-01-31 DIAGNOSIS — Z79891 Long term (current) use of opiate analgesic: Secondary | ICD-10-CM | POA: Diagnosis not present

## 2019-03-22 DIAGNOSIS — I10 Essential (primary) hypertension: Secondary | ICD-10-CM | POA: Diagnosis not present

## 2019-03-22 DIAGNOSIS — R7303 Prediabetes: Secondary | ICD-10-CM | POA: Diagnosis not present

## 2019-03-22 DIAGNOSIS — E785 Hyperlipidemia, unspecified: Secondary | ICD-10-CM | POA: Diagnosis not present

## 2019-03-22 DIAGNOSIS — F321 Major depressive disorder, single episode, moderate: Secondary | ICD-10-CM | POA: Diagnosis not present

## 2019-03-22 DIAGNOSIS — E039 Hypothyroidism, unspecified: Secondary | ICD-10-CM | POA: Diagnosis not present

## 2019-03-22 DIAGNOSIS — F419 Anxiety disorder, unspecified: Secondary | ICD-10-CM | POA: Diagnosis not present

## 2019-03-22 DIAGNOSIS — R5381 Other malaise: Secondary | ICD-10-CM | POA: Diagnosis not present

## 2019-03-22 DIAGNOSIS — R5383 Other fatigue: Secondary | ICD-10-CM | POA: Diagnosis not present

## 2019-03-22 DIAGNOSIS — E559 Vitamin D deficiency, unspecified: Secondary | ICD-10-CM | POA: Diagnosis not present

## 2019-03-22 DIAGNOSIS — D508 Other iron deficiency anemias: Secondary | ICD-10-CM | POA: Diagnosis not present

## 2019-05-20 ENCOUNTER — Other Ambulatory Visit: Payer: Self-pay

## 2019-05-20 ENCOUNTER — Emergency Department (HOSPITAL_COMMUNITY)
Admission: EM | Admit: 2019-05-20 | Discharge: 2019-05-20 | Disposition: A | Discharge status: Home / Self Care | Payer: PPO | Attending: Emergency Medicine | Admitting: Emergency Medicine

## 2019-05-20 ENCOUNTER — Emergency Department (HOSPITAL_COMMUNITY): Payer: PPO

## 2019-05-20 DIAGNOSIS — I10 Essential (primary) hypertension: Secondary | ICD-10-CM | POA: Insufficient documentation

## 2019-05-20 DIAGNOSIS — Z79899 Other long term (current) drug therapy: Secondary | ICD-10-CM | POA: Diagnosis not present

## 2019-05-20 DIAGNOSIS — M5442 Lumbago with sciatica, left side: Secondary | ICD-10-CM

## 2019-05-20 DIAGNOSIS — F17210 Nicotine dependence, cigarettes, uncomplicated: Secondary | ICD-10-CM | POA: Insufficient documentation

## 2019-05-20 DIAGNOSIS — M545 Low back pain: Secondary | ICD-10-CM | POA: Diagnosis not present

## 2019-05-20 MED ORDER — METHOCARBAMOL 500 MG PO TABS
500.0000 mg | ORAL_TABLET | Freq: Once | ORAL | Status: AC
  Administered 2019-05-20: 500 mg via ORAL
  Filled 2019-05-20: qty 1

## 2019-05-20 MED ORDER — KETOROLAC TROMETHAMINE 30 MG/ML IJ SOLN
30.0000 mg | Freq: Once | INTRAMUSCULAR | Status: AC
  Administered 2019-05-20: 30 mg via INTRAMUSCULAR
  Filled 2019-05-20: qty 1

## 2019-05-20 MED ORDER — METHOCARBAMOL 500 MG PO TABS: 500.0000 mg | ORAL_TABLET | Freq: Two times a day (BID) | ORAL | 0 refills | Status: DC

## 2019-05-20 MED ORDER — NAPROXEN 500 MG PO TABS: 500.0000 mg | ORAL_TABLET | Freq: Two times a day (BID) | ORAL | 0 refills | Status: DC

## 2019-05-20 MED ORDER — LIDOCAINE 5 % EX PTCH
1.0000 | MEDICATED_PATCH | CUTANEOUS | Status: DC
  Administered 2019-05-20: 1 via TRANSDERMAL
  Filled 2019-05-20: qty 1

## 2019-05-20 MED ORDER — MORPHINE SULFATE (PF) 4 MG/ML IV SOLN
4.0000 mg | Freq: Once | INTRAVENOUS | Status: AC
  Administered 2019-05-20: 4 mg via INTRAMUSCULAR
  Filled 2019-05-20: qty 1

## 2019-05-20 NOTE — Discharge Instructions (Signed)
You were seen here today for Back Pain: Low back pain is discomfort in the lower back that may be due to injuries to muscles and ligaments around the spine. Occasionally, it may be caused by a problem to a part of the spine called a disc. Your back pain should be treated with medicines listed below as well as back exercises and this back pain should get better over the next 2 weeks. Most patients get completely well in 4 weeks. It is important to know however, if you develop severe or worsening pain, low back pain with fever, numbness, weakness or inability to walk or urinate, you should return to the ER immediately.  Please follow up with your doctor this week for a recheck if still having symptoms.  HOME INSTRUCTIONS Self - care:  The application of heat can help soothe the pain.  Maintaining your daily activities, including walking (this is encouraged), as it will help you get better faster than just staying in bed. Do not life, push, pull anything more than 10 pounds for the next week. I am attaching back exercises that you can do at home to help facilitate your recovery.   Back Exercises - I have attached a handout on back exercises that can be done at home to help facilitate your recovery.   Medications are also useful to help with pain control.   Acetaminophen.  This medication is generally safe, and found over the counter. Take as directed for your age. You should not take more than 8 of the extra strength (500mg ) pills a day (max dose is 4000mg  total OVER one day)  Non steroidal anti inflammatory: This includes medications including Ibuprofen, naproxen and Mobic; These medications help both pain and swelling and are very useful in treating back pain.  They should be taken with food, as they can cause stomach upset, and more seriously, stomach bleeding. Do not combine the medications.   Lidocaine Patch: Salon Pas lidocaine patches (blue and silver box) can be purchased over the counter and worn  for 12 hours for local pain relief   Muscle relaxants:  These medications can help with muscle tightness that is a cause of lower back pain.  Most of these medications can cause drowsiness, and it is not safe to drive or use dangerous machinery while taking them. They are primarily helpful when taken at night before sleep.  You will need to follow up with Dr. Gladstone Lighter in 1-2 weeks for reassessment and persistent symptoms.  Be aware that if you develop new symptoms, such as a fever, leg weakness, difficulty with or loss of control of your urine or bowels, abdominal pain, or more severe pain, you will need to seek medical attention and/or return to the Emergency department.  Additional Information:  Your vital signs today were: BP 121/86 (BP Location: Right Arm)    Pulse 75    Temp 98.1 F (36.7 C) (Oral)    Resp 16    Ht 6\' 1"  (1.854 m)    Wt 111.1 kg    SpO2 99%    BMI 32.32 kg/m  If your blood pressure (BP) was elevated above 135/85 this visit, please have this repeated by your doctor within one month. ---------------'

## 2019-05-20 NOTE — ED Triage Notes (Signed)
Pt reports lower back pain for the last 2 days. Takes oxy 10-325mg  not help. Denies loss/bowel bladder. VSS. NAD at present.

## 2019-05-20 NOTE — ED Provider Notes (Signed)
[facility] EMERGENCY DEPARTMENT Provider Note   CSN: [id number] Arrival date & time: 05/20/19  1343     History   Chief Complaint Chief Complaint  Patient presents with  . Back Pain    HPI Victor Oconnor is a 48 y.o. male.     Victor Oconnor is a 48 y.o. male with history of hypertension, hyperlipidemia, lumbar disc herniation and spinal stenosis with previous back surgeries, who presents to the ED for evaluation of 3 days of worsening left lower back pain. Patient reports he is not sure what brought on the pain he denies injury or trauma. States that he is already on chronic pain management for his low back pain and takes 10 mg Percocet four times daily, but this has not been helping with his pain. He has not taken any other medications in addition to this. He reports that the pain sometimes radiates into his left buttock and leg when he is trying to walk. He denies numbness, tingling or weakness in the leg. Denies loss of bowel or bladder control or saddle anesthesia. No fevers, urinary symptoms or associated abdominal pain. He has not seen his orthopedist regarding his worsening back pain, reports that he feels like over the past several months he has been having increasing pain but more severe than usual over the past 2 to 3 days.     Past Medical History:  Diagnosis Date  . Anxiety   . Depression   . Hyperlipidemia   . Hypertension    no meds for hypertension  . Hypothyroidism   . Muscle twitch   . Pain    severe pain left shoulder   s/p left shoulder rotator cuff repair on 01/14/12  . Sleep apnea    prior to gastric bypass 2011- no cpap at present    Patient Active Problem List   Diagnosis Date Noted  . Spinal stenosis of lumbar region with neurogenic claudication 05/15/2017  . Lumbar disc herniation with radiculopathy 05/14/2017  . HTN (hypertension) 03/03/2012  . Dyslipidemia 03/03/2012  . Hypothyroid 03/03/2012  . Obesity 03/03/2012  .  Depression 03/03/2012  . Anxiety 03/03/2012  . Cellulitis of shoulder 03/02/2012  . Complete rotator cuff rupture of left shoulder 01/15/2012  . S/P rotator cuff repair 01/15/2012    Past Surgical History:  Procedure Laterality Date  . BACK SURGERY    . GASTRIC BYPASS    . HEMI-MICRODISCECTOMY LUMBAR LAMINECTOMY LEVEL 1 N/A 05/15/2017   Procedure: Hemilaminectomy lumbar microdisectomy L4-L5 right, decompression;  Surgeon: Ranee Gosselin, MD;  Location: [facility];  Service: Orthopedics;  Laterality: N/A;  . HERNIA REPAIR     as a child  . SHOULDER OPEN ROTATOR CUFF REPAIR  01/14/2012   Procedure: ROTATOR CUFF REPAIR SHOULDER OPEN;  Surgeon: Jacki Cones, MD;  Location: [facility];  Service: Orthopedics;  Laterality: Left;  with graft and anchors        Home Medications    Prior to Admission medications   Medication Sig Start Date End Date Taking? Authorizing Provider  DULoxetine (CYMBALTA) 60 MG capsule Take 60 mg by mouth 2 (two) times daily. 60    [provider]  hydrOXYzine (ATARAX/VISTARIL) 25 MG tablet Take 1 tablet (25 mg total) by mouth every 6 (six) hours. Patient not taking: Reported on 10/19/2017 09/29/17   Maczis, Elmer Sow, PA-C  indapamide (LOZOL) 1.25 MG tablet Take 1.25 mg by mouth daily. 04/24/17   [provider]  levothyroxine (SYNTHROID, LEVOTHROID) 200 MCG tablet  Take 200 mcg by mouth daily before breakfast.     [provider]  lisinopril (PRINIVIL,ZESTRIL) 10 MG tablet Take 10 mg by mouth every morning.    [provider]  methocarbamol (ROBAXIN) 500 MG tablet Take 1 tablet (500 mg total) by mouth 2 (two) times daily. 05/20/19   Jacqlyn Larsen, PA-C  metoprolol tartrate (LOPRESSOR) 25 MG tablet Take 25 mg by mouth daily. 04/24/17   [provider]  naproxen (NAPROSYN) 500 MG tablet Take 1 tablet (500 mg total) by mouth 2 (two) times daily. 05/20/19   Jacqlyn Larsen, PA-C  omeprazole (PRILOSEC) 20 MG capsule Take 1 capsule (20  mg total) by mouth daily. Patient not taking: Reported on 10/19/2017 09/29/17   Maczis, Barth Kirks, PA-C  ondansetron (ZOFRAN) 4 MG tablet Take 1 tablet (4 mg total) by mouth every 8 (eight) hours as needed for nausea or vomiting. Patient not taking: Reported on 10/19/2017 09/29/17   Maczis, Barth Kirks, PA-C  oxyCODONE-acetaminophen (PERCOCET) 10-325 MG tablet Take 1 tablet by mouth every 4 (four) hours as needed for pain. 05/15/17   Ardeen Jourdain, PA-C    Family History Family History  Problem Relation Age of Onset  . Diabetes Father     Social History Social History   Tobacco Use  . Smoking status: Current Every Day Smoker    Packs/day: 0.75    Years: 4.00    Pack years: 3.00    Types: Cigarettes    Last attempt to quit: 01/07/2007    Years since quitting: 12.3  . Smokeless tobacco: Never Used  Substance Use Topics  . Alcohol use: Yes    Comment: 12 pk week  . Drug use: No     Allergies   Penicillins   Review of Systems Review of Systems  Constitutional: Negative for chills and fever.  HENT: Negative.   Respiratory: Negative for shortness of breath.   Cardiovascular: Negative for chest pain.  Gastrointestinal: Negative for abdominal pain, constipation, diarrhea, nausea and vomiting.  Genitourinary: Negative for dysuria, flank pain, frequency and hematuria.  Musculoskeletal: Positive for back pain. Negative for arthralgias, gait problem, joint swelling, myalgias and neck pain.  Skin: Negative for color change, rash and wound.  Neurological: Negative for weakness and numbness.     Physical Exam Updated Vital Signs BP 121/86 (BP Location: Right Arm)   Pulse 75   Temp 98.1 F (36.7 C) (Oral)   Resp 16   Ht 6\' 1"  (1.854 m)   Wt 111.1 kg   SpO2 99%   BMI 32.32 kg/m   Physical Exam Vitals signs and nursing note reviewed.  Constitutional:      General: He is not in acute distress.    Appearance: He is well-developed. He is not diaphoretic.  HENT:     Head:  Atraumatic.  Eyes:     General:        Right eye: No discharge.        Left eye: No discharge.  Neck:     Musculoskeletal: Neck supple.  Cardiovascular:     Pulses:          Radial pulses are 2+ on the right side and 2+ on the left side.       Dorsalis pedis pulses are 2+ on the right side and 2+ on the left side.       Posterior tibial pulses are 2+ on the right side and 2+ on the left side.  Pulmonary:  Effort: Pulmonary effort is normal. No respiratory distress.  Abdominal:     General: Bowel sounds are normal. There is no distension.     Palpations: Abdomen is soft. There is no mass.     Tenderness: There is no abdominal tenderness. There is no guarding.     Comments: Abdomen soft, nondistended, nontender to palpation in all quadrants without guarding or peritoneal signs, no CVA tenderness bilaterally  Musculoskeletal:     Comments: Tenderness to palpation over Left low back.  Pain made worse with range of motion of the lower extremities, positive straight leg raise on the left.  Skin:    General: Skin is warm and dry.     Capillary Refill: Capillary refill takes less than 2 seconds.  Neurological:     Mental Status: He is alert and oriented to person, place, and time.     Comments: Alert, clear speech, following commands. Moving all extremities without difficulty. Bilateral lower extremities with 5/5 strength in proximal and distal muscle groups and with dorsi and plantar flexion. Sensation intact in bilateral lower extremities. 2+ patellar DTRs bilaterally. Ambulatory with steady gait  Psychiatric:        Behavior: Behavior normal.      ED Treatments / Results  Labs (all labs ordered are listed, but only abnormal results are displayed) Labs Reviewed - No data to display  EKG None  Radiology Dg Lumbar Spine Complete  Result Date: 05/20/2019 CLINICAL DATA:  Low back pain for 2 days. EXAM: LUMBAR SPINE - COMPLETE 4+ VIEW COMPARISON:  None. FINDINGS: There is no  acute fracture or dislocation. There is mild scoliosis of the spine. Mild degenerative joint changes of thoracolumbar junction and lower lumbar spine with narrowed joint space and osteophyte formation are noted. IMPRESSION: No acute abnormality. Mild degenerative joint changes of thoracolumbar junction and lower lumbar spine. Electronically Signed   By: Sherian Rein M.D.   On: 05/20/2019 15:59    Procedures Procedures (including critical care time)  Medications Ordered in ED Medications  lidocaine (LIDODERM) 5 % 1 patch (1 patch Transdermal Patch Applied 05/20/19 1447)  ketorolac (TORADOL) 30 MG/ML injection 30 mg (30 mg Intramuscular Given 05/20/19 1440)  methocarbamol (ROBAXIN) tablet 500 mg (500 mg Oral Given 05/20/19 1440)  morphine 4 MG/ML injection 4 mg (4 mg Intramuscular Given 05/20/19 1444)     Initial Impression / Assessment and Plan / ED Course  I have reviewed the triage vital signs and the nursing notes.  Pertinent labs & imaging results that were available during my care of the patient were reviewed by me and considered in my medical decision making (see chart for details).  Patient with acute exacerbation of his chronic back pain, previous surgeries done by Dr. Darrelyn Hillock. X-rays unremarkable, degenerative changes noted. No neurological deficits and normal neuro exam.  Patient can walk but states is painful.  No loss of bowel or bladder control.  No concern for cauda equina.  No fever, night sweats, weight loss, h/o cancer, IVDU. Pain treated and improved in the ED and patient ambulatory without difficulty. RICE protocol and pain medicine indicated and discussed with patient.    Final Clinical Impressions(s) / ED Diagnoses   Final diagnoses:  Left-sided low back pain with left-sided sciatica, unspecified chronicity    ED Discharge Orders         Ordered    naproxen (NAPROSYN) 500 MG tablet  2 times daily     05/20/19 1641    methocarbamol (ROBAXIN) 500 MG  tablet  2 times  daily     05/20/19 1641           Dartha LodgeFord, Faysal Fenoglio N, [place] JerseyPA-C 05/20/19 1836    Derwood KaplanNanavati, Ankit, MD 05/21/19 [phone]

## 2019-05-20 NOTE — ED Notes (Signed)
Pt discharge instructions and prescriptions reviewed with the patient. Pt verbalized understanding of both. Pt discharged. 

## 2019-05-23 DIAGNOSIS — M545 Low back pain: Secondary | ICD-10-CM | POA: Diagnosis not present

## 2019-05-23 DIAGNOSIS — M5116 Intervertebral disc disorders with radiculopathy, lumbar region: Secondary | ICD-10-CM | POA: Diagnosis not present

## 2019-05-23 DIAGNOSIS — M5136 Other intervertebral disc degeneration, lumbar region: Secondary | ICD-10-CM | POA: Diagnosis not present

## 2019-05-30 DIAGNOSIS — M545 Low back pain: Secondary | ICD-10-CM | POA: Diagnosis not present

## 2019-06-14 DIAGNOSIS — M961 Postlaminectomy syndrome, not elsewhere classified: Secondary | ICD-10-CM | POA: Diagnosis not present

## 2019-06-14 DIAGNOSIS — Z9889 Other specified postprocedural states: Secondary | ICD-10-CM | POA: Diagnosis not present

## 2019-06-14 DIAGNOSIS — M5136 Other intervertebral disc degeneration, lumbar region: Secondary | ICD-10-CM | POA: Diagnosis not present

## 2019-06-21 DIAGNOSIS — M4316 Spondylolisthesis, lumbar region: Secondary | ICD-10-CM | POA: Diagnosis not present

## 2019-07-19 DIAGNOSIS — Z79899 Other long term (current) drug therapy: Secondary | ICD-10-CM | POA: Diagnosis not present

## 2019-07-19 DIAGNOSIS — M5136 Other intervertebral disc degeneration, lumbar region: Secondary | ICD-10-CM | POA: Diagnosis not present

## 2019-07-19 DIAGNOSIS — Z5181 Encounter for therapeutic drug level monitoring: Secondary | ICD-10-CM | POA: Diagnosis not present

## 2019-07-19 DIAGNOSIS — Z79891 Long term (current) use of opiate analgesic: Secondary | ICD-10-CM | POA: Diagnosis not present

## 2019-07-21 DIAGNOSIS — M5136 Other intervertebral disc degeneration, lumbar region: Secondary | ICD-10-CM | POA: Diagnosis not present

## 2019-08-22 DIAGNOSIS — M545 Low back pain: Secondary | ICD-10-CM | POA: Diagnosis not present

## 2019-08-22 DIAGNOSIS — G894 Chronic pain syndrome: Secondary | ICD-10-CM | POA: Diagnosis not present

## 2019-09-26 DIAGNOSIS — E785 Hyperlipidemia, unspecified: Secondary | ICD-10-CM | POA: Diagnosis not present

## 2019-09-26 DIAGNOSIS — D508 Other iron deficiency anemias: Secondary | ICD-10-CM | POA: Diagnosis not present

## 2019-09-26 DIAGNOSIS — R7303 Prediabetes: Secondary | ICD-10-CM | POA: Diagnosis not present

## 2019-09-26 DIAGNOSIS — R5383 Other fatigue: Secondary | ICD-10-CM | POA: Diagnosis not present

## 2019-09-26 DIAGNOSIS — I10 Essential (primary) hypertension: Secondary | ICD-10-CM | POA: Diagnosis not present

## 2019-09-26 DIAGNOSIS — N611 Abscess of the breast and nipple: Secondary | ICD-10-CM | POA: Diagnosis not present

## 2019-09-26 DIAGNOSIS — F330 Major depressive disorder, recurrent, mild: Secondary | ICD-10-CM | POA: Diagnosis not present

## 2019-09-26 DIAGNOSIS — E039 Hypothyroidism, unspecified: Secondary | ICD-10-CM | POA: Diagnosis not present

## 2019-09-26 DIAGNOSIS — R5381 Other malaise: Secondary | ICD-10-CM | POA: Diagnosis not present

## 2019-09-26 DIAGNOSIS — F419 Anxiety disorder, unspecified: Secondary | ICD-10-CM | POA: Diagnosis not present

## 2019-12-16 DIAGNOSIS — M5416 Radiculopathy, lumbar region: Secondary | ICD-10-CM | POA: Diagnosis not present

## 2020-03-12 DIAGNOSIS — F419 Anxiety disorder, unspecified: Secondary | ICD-10-CM | POA: Diagnosis not present

## 2020-03-12 DIAGNOSIS — N611 Abscess of the breast and nipple: Secondary | ICD-10-CM | POA: Diagnosis not present

## 2020-03-12 DIAGNOSIS — K6289 Other specified diseases of anus and rectum: Secondary | ICD-10-CM | POA: Diagnosis not present

## 2020-04-09 DIAGNOSIS — Z20828 Contact with and (suspected) exposure to other viral communicable diseases: Secondary | ICD-10-CM | POA: Diagnosis not present

## 2020-04-25 DIAGNOSIS — M5136 Other intervertebral disc degeneration, lumbar region: Secondary | ICD-10-CM | POA: Diagnosis not present

## 2020-04-25 DIAGNOSIS — L03119 Cellulitis of unspecified part of limb: Secondary | ICD-10-CM | POA: Diagnosis not present

## 2020-06-20 DIAGNOSIS — R079 Chest pain, unspecified: Secondary | ICD-10-CM | POA: Diagnosis not present

## 2020-06-29 DIAGNOSIS — R5381 Other malaise: Secondary | ICD-10-CM | POA: Diagnosis not present

## 2020-06-29 DIAGNOSIS — R7303 Prediabetes: Secondary | ICD-10-CM | POA: Diagnosis not present

## 2020-06-29 DIAGNOSIS — F419 Anxiety disorder, unspecified: Secondary | ICD-10-CM | POA: Diagnosis not present

## 2020-06-29 DIAGNOSIS — L03811 Cellulitis of head [any part, except face]: Secondary | ICD-10-CM | POA: Diagnosis not present

## 2020-06-29 DIAGNOSIS — Z1321 Encounter for screening for nutritional disorder: Secondary | ICD-10-CM | POA: Diagnosis not present

## 2020-06-29 DIAGNOSIS — D509 Iron deficiency anemia, unspecified: Secondary | ICD-10-CM | POA: Diagnosis not present

## 2020-06-29 DIAGNOSIS — I10 Essential (primary) hypertension: Secondary | ICD-10-CM | POA: Diagnosis not present

## 2020-06-29 DIAGNOSIS — Z125 Encounter for screening for malignant neoplasm of prostate: Secondary | ICD-10-CM | POA: Diagnosis not present

## 2020-06-29 DIAGNOSIS — F418 Other specified anxiety disorders: Secondary | ICD-10-CM | POA: Diagnosis not present

## 2020-06-29 DIAGNOSIS — D508 Other iron deficiency anemias: Secondary | ICD-10-CM | POA: Diagnosis not present

## 2020-06-29 DIAGNOSIS — E039 Hypothyroidism, unspecified: Secondary | ICD-10-CM | POA: Diagnosis not present

## 2020-06-29 DIAGNOSIS — R5383 Other fatigue: Secondary | ICD-10-CM | POA: Diagnosis not present

## 2020-06-29 DIAGNOSIS — Z0000 Encounter for general adult medical examination without abnormal findings: Secondary | ICD-10-CM | POA: Diagnosis not present

## 2020-06-29 DIAGNOSIS — Z8249 Family history of ischemic heart disease and other diseases of the circulatory system: Secondary | ICD-10-CM | POA: Diagnosis not present

## 2020-06-29 DIAGNOSIS — F320 Major depressive disorder, single episode, mild: Secondary | ICD-10-CM | POA: Diagnosis not present

## 2020-06-29 DIAGNOSIS — F330 Major depressive disorder, recurrent, mild: Secondary | ICD-10-CM | POA: Diagnosis not present

## 2020-09-17 DIAGNOSIS — Z5181 Encounter for therapeutic drug level monitoring: Secondary | ICD-10-CM | POA: Diagnosis not present

## 2020-09-17 DIAGNOSIS — Z79899 Other long term (current) drug therapy: Secondary | ICD-10-CM | POA: Diagnosis not present

## 2020-09-17 DIAGNOSIS — M25512 Pain in left shoulder: Secondary | ICD-10-CM | POA: Diagnosis not present

## 2020-09-17 DIAGNOSIS — M5459 Other low back pain: Secondary | ICD-10-CM | POA: Diagnosis not present

## 2020-09-17 DIAGNOSIS — M25511 Pain in right shoulder: Secondary | ICD-10-CM | POA: Diagnosis not present

## 2020-09-21 DIAGNOSIS — M1389 Other specified arthritis, multiple sites: Secondary | ICD-10-CM | POA: Diagnosis not present

## 2020-09-21 DIAGNOSIS — M26629 Arthralgia of temporomandibular joint, unspecified side: Secondary | ICD-10-CM | POA: Diagnosis not present

## 2020-09-25 DIAGNOSIS — R110 Nausea: Secondary | ICD-10-CM | POA: Diagnosis not present

## 2020-09-25 DIAGNOSIS — R7303 Prediabetes: Secondary | ICD-10-CM | POA: Diagnosis not present

## 2020-09-25 DIAGNOSIS — R634 Abnormal weight loss: Secondary | ICD-10-CM | POA: Diagnosis not present

## 2020-09-27 DIAGNOSIS — J9811 Atelectasis: Secondary | ICD-10-CM | POA: Diagnosis not present

## 2020-09-27 DIAGNOSIS — R160 Hepatomegaly, not elsewhere classified: Secondary | ICD-10-CM | POA: Diagnosis not present

## 2020-09-27 DIAGNOSIS — R197 Diarrhea, unspecified: Secondary | ICD-10-CM | POA: Diagnosis not present

## 2020-09-27 DIAGNOSIS — R9389 Abnormal findings on diagnostic imaging of other specified body structures: Secondary | ICD-10-CM | POA: Diagnosis not present

## 2020-09-27 DIAGNOSIS — N3289 Other specified disorders of bladder: Secondary | ICD-10-CM | POA: Diagnosis not present

## 2020-09-27 DIAGNOSIS — N62 Hypertrophy of breast: Secondary | ICD-10-CM | POA: Diagnosis not present

## 2020-09-27 DIAGNOSIS — R634 Abnormal weight loss: Secondary | ICD-10-CM | POA: Diagnosis not present

## 2020-09-27 DIAGNOSIS — R112 Nausea with vomiting, unspecified: Secondary | ICD-10-CM | POA: Diagnosis not present

## 2020-10-02 DIAGNOSIS — R1013 Epigastric pain: Secondary | ICD-10-CM | POA: Diagnosis not present

## 2020-10-02 DIAGNOSIS — R634 Abnormal weight loss: Secondary | ICD-10-CM | POA: Diagnosis not present

## 2020-10-02 DIAGNOSIS — R110 Nausea: Secondary | ICD-10-CM | POA: Diagnosis not present

## 2020-10-02 DIAGNOSIS — R160 Hepatomegaly, not elsewhere classified: Secondary | ICD-10-CM | POA: Diagnosis not present

## 2020-10-02 DIAGNOSIS — Z800 Family history of malignant neoplasm of digestive organs: Secondary | ICD-10-CM | POA: Diagnosis not present

## 2020-10-06 DIAGNOSIS — T401X1A Poisoning by heroin, accidental (unintentional), initial encounter: Secondary | ICD-10-CM | POA: Diagnosis not present

## 2020-10-06 DIAGNOSIS — I700 Atherosclerosis of aorta: Secondary | ICD-10-CM | POA: Diagnosis not present

## 2020-10-06 DIAGNOSIS — J984 Other disorders of lung: Secondary | ICD-10-CM | POA: Diagnosis not present

## 2020-10-06 DIAGNOSIS — K828 Other specified diseases of gallbladder: Secondary | ICD-10-CM | POA: Diagnosis not present

## 2020-10-06 DIAGNOSIS — I10 Essential (primary) hypertension: Secondary | ICD-10-CM | POA: Diagnosis not present

## 2020-10-06 DIAGNOSIS — M47819 Spondylosis without myelopathy or radiculopathy, site unspecified: Secondary | ICD-10-CM | POA: Diagnosis not present

## 2020-10-06 DIAGNOSIS — N179 Acute kidney failure, unspecified: Secondary | ICD-10-CM | POA: Diagnosis not present

## 2020-10-06 DIAGNOSIS — R413 Other amnesia: Secondary | ICD-10-CM | POA: Diagnosis not present

## 2020-10-06 DIAGNOSIS — J9811 Atelectasis: Secondary | ICD-10-CM | POA: Diagnosis not present

## 2020-10-06 DIAGNOSIS — R231 Pallor: Secondary | ICD-10-CM | POA: Diagnosis not present

## 2020-10-06 DIAGNOSIS — R410 Disorientation, unspecified: Secondary | ICD-10-CM | POA: Diagnosis not present

## 2020-10-06 DIAGNOSIS — R0902 Hypoxemia: Secondary | ICD-10-CM | POA: Diagnosis not present

## 2020-10-06 DIAGNOSIS — R4020 Unspecified coma: Secondary | ICD-10-CM | POA: Diagnosis not present

## 2020-10-06 DIAGNOSIS — R61 Generalized hyperhidrosis: Secondary | ICD-10-CM | POA: Diagnosis not present

## 2020-10-06 DIAGNOSIS — R404 Transient alteration of awareness: Secondary | ICD-10-CM | POA: Diagnosis not present

## 2020-10-06 DIAGNOSIS — I959 Hypotension, unspecified: Secondary | ICD-10-CM | POA: Diagnosis not present

## 2020-10-07 DIAGNOSIS — Z880 Allergy status to penicillin: Secondary | ICD-10-CM | POA: Diagnosis not present

## 2020-10-07 DIAGNOSIS — K828 Other specified diseases of gallbladder: Secondary | ICD-10-CM | POA: Diagnosis not present

## 2020-10-07 DIAGNOSIS — F1910 Other psychoactive substance abuse, uncomplicated: Secondary | ICD-10-CM | POA: Diagnosis not present

## 2020-10-07 DIAGNOSIS — Z79899 Other long term (current) drug therapy: Secondary | ICD-10-CM | POA: Diagnosis not present

## 2020-10-07 DIAGNOSIS — I10 Essential (primary) hypertension: Secondary | ICD-10-CM | POA: Diagnosis not present

## 2020-10-07 DIAGNOSIS — F1020 Alcohol dependence, uncomplicated: Secondary | ICD-10-CM | POA: Diagnosis not present

## 2020-10-07 DIAGNOSIS — J9811 Atelectasis: Secondary | ICD-10-CM | POA: Diagnosis not present

## 2020-10-07 DIAGNOSIS — T50901A Poisoning by unspecified drugs, medicaments and biological substances, accidental (unintentional), initial encounter: Secondary | ICD-10-CM | POA: Diagnosis not present

## 2020-10-07 DIAGNOSIS — F419 Anxiety disorder, unspecified: Secondary | ICD-10-CM | POA: Diagnosis not present

## 2020-10-07 DIAGNOSIS — R61 Generalized hyperhidrosis: Secondary | ICD-10-CM | POA: Diagnosis not present

## 2020-10-07 DIAGNOSIS — F17210 Nicotine dependence, cigarettes, uncomplicated: Secondary | ICD-10-CM | POA: Diagnosis not present

## 2020-10-07 DIAGNOSIS — M1990 Unspecified osteoarthritis, unspecified site: Secondary | ICD-10-CM | POA: Diagnosis not present

## 2020-10-07 DIAGNOSIS — I700 Atherosclerosis of aorta: Secondary | ICD-10-CM | POA: Diagnosis not present

## 2020-10-07 DIAGNOSIS — N179 Acute kidney failure, unspecified: Secondary | ICD-10-CM | POA: Diagnosis not present

## 2020-10-07 DIAGNOSIS — N170 Acute kidney failure with tubular necrosis: Secondary | ICD-10-CM | POA: Diagnosis not present

## 2020-10-07 DIAGNOSIS — M47819 Spondylosis without myelopathy or radiculopathy, site unspecified: Secondary | ICD-10-CM | POA: Diagnosis not present

## 2020-10-07 DIAGNOSIS — T401X1A Poisoning by heroin, accidental (unintentional), initial encounter: Secondary | ICD-10-CM | POA: Diagnosis not present

## 2020-10-07 DIAGNOSIS — J984 Other disorders of lung: Secondary | ICD-10-CM | POA: Diagnosis not present

## 2020-10-07 DIAGNOSIS — R413 Other amnesia: Secondary | ICD-10-CM | POA: Diagnosis not present

## 2020-10-07 DIAGNOSIS — R4020 Unspecified coma: Secondary | ICD-10-CM | POA: Diagnosis not present

## 2020-10-07 DIAGNOSIS — E039 Hypothyroidism, unspecified: Secondary | ICD-10-CM | POA: Diagnosis not present

## 2020-10-17 DIAGNOSIS — R609 Edema, unspecified: Secondary | ICD-10-CM | POA: Diagnosis not present

## 2020-10-17 DIAGNOSIS — R601 Generalized edema: Secondary | ICD-10-CM | POA: Diagnosis not present

## 2020-10-17 DIAGNOSIS — D649 Anemia, unspecified: Secondary | ICD-10-CM | POA: Diagnosis not present

## 2020-10-17 DIAGNOSIS — S2231XA Fracture of one rib, right side, initial encounter for closed fracture: Secondary | ICD-10-CM | POA: Diagnosis not present

## 2020-10-24 DIAGNOSIS — S2231XD Fracture of one rib, right side, subsequent encounter for fracture with routine healing: Secondary | ICD-10-CM | POA: Diagnosis not present

## 2020-10-24 DIAGNOSIS — N179 Acute kidney failure, unspecified: Secondary | ICD-10-CM | POA: Diagnosis not present

## 2020-10-24 DIAGNOSIS — R062 Wheezing: Secondary | ICD-10-CM | POA: Diagnosis not present

## 2020-10-24 DIAGNOSIS — R600 Localized edema: Secondary | ICD-10-CM | POA: Diagnosis not present

## 2020-10-24 DIAGNOSIS — T50901D Poisoning by unspecified drugs, medicaments and biological substances, accidental (unintentional), subsequent encounter: Secondary | ICD-10-CM | POA: Diagnosis not present

## 2021-03-23 DIAGNOSIS — I340 Nonrheumatic mitral (valve) insufficiency: Secondary | ICD-10-CM | POA: Diagnosis not present

## 2021-09-21 IMAGING — DX DG LUMBAR SPINE COMPLETE 4+V
5 series · 5 of 5 positions shown · non-contrast
Comparison: None.

CLINICAL DATA: Low back pain for 2 days.

EXAM:
LUMBAR SPINE - COMPLETE 4+ VIEW

[l-spine ap]
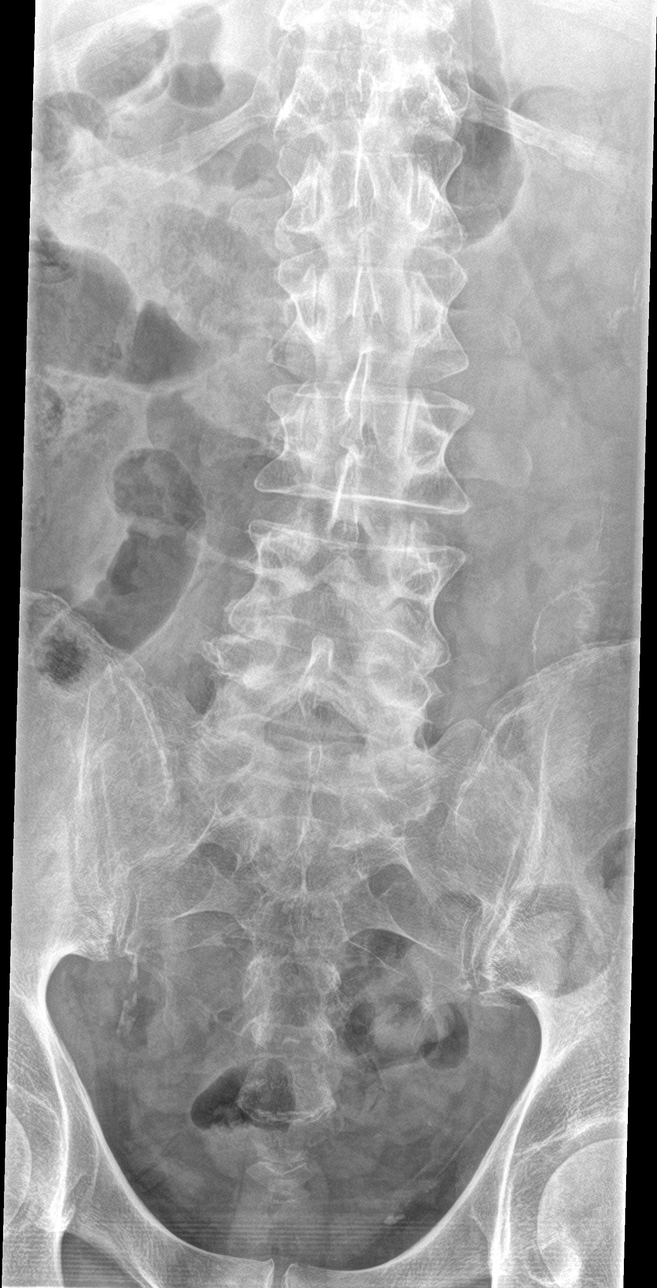

[l-spine obl (1 of 3)]
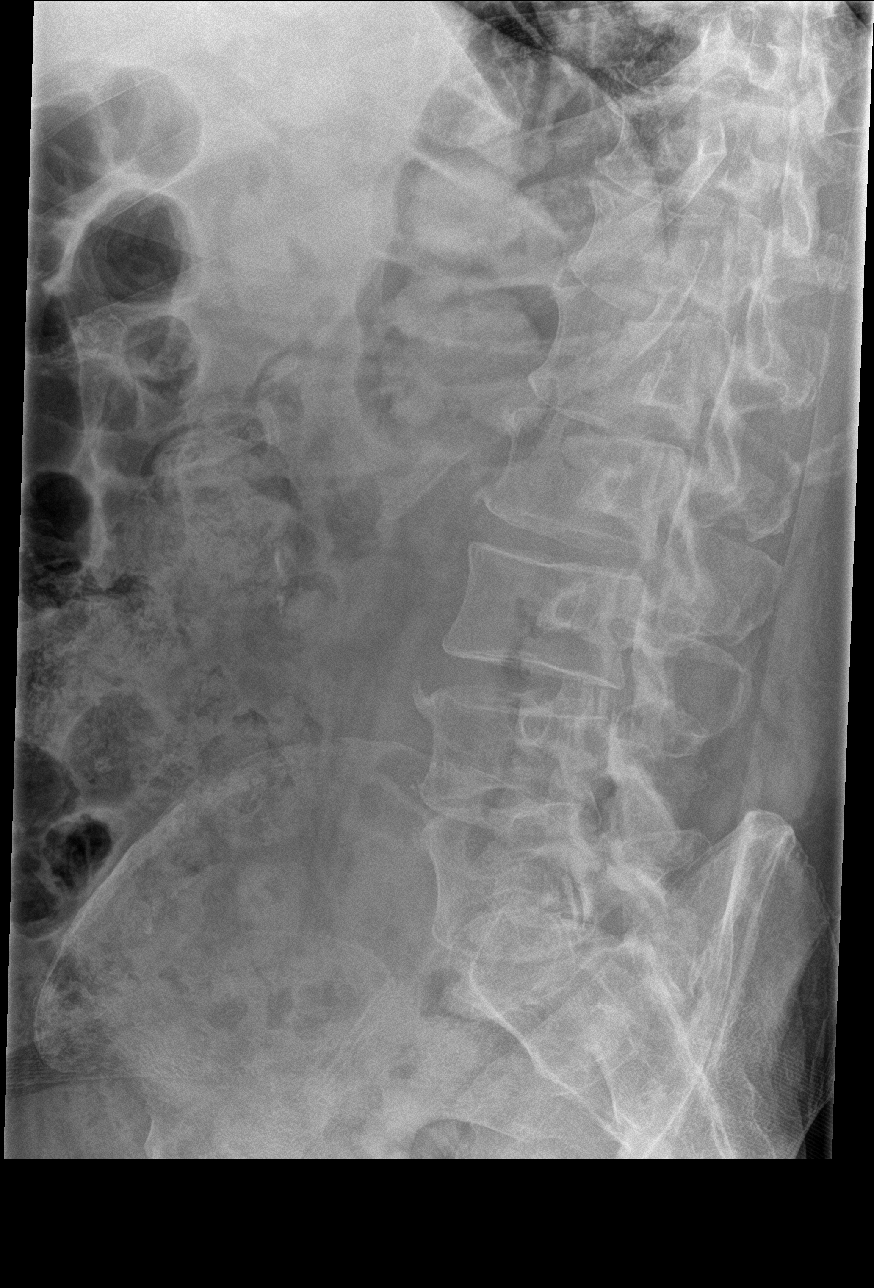

[l-spine obl (2 of 3)]
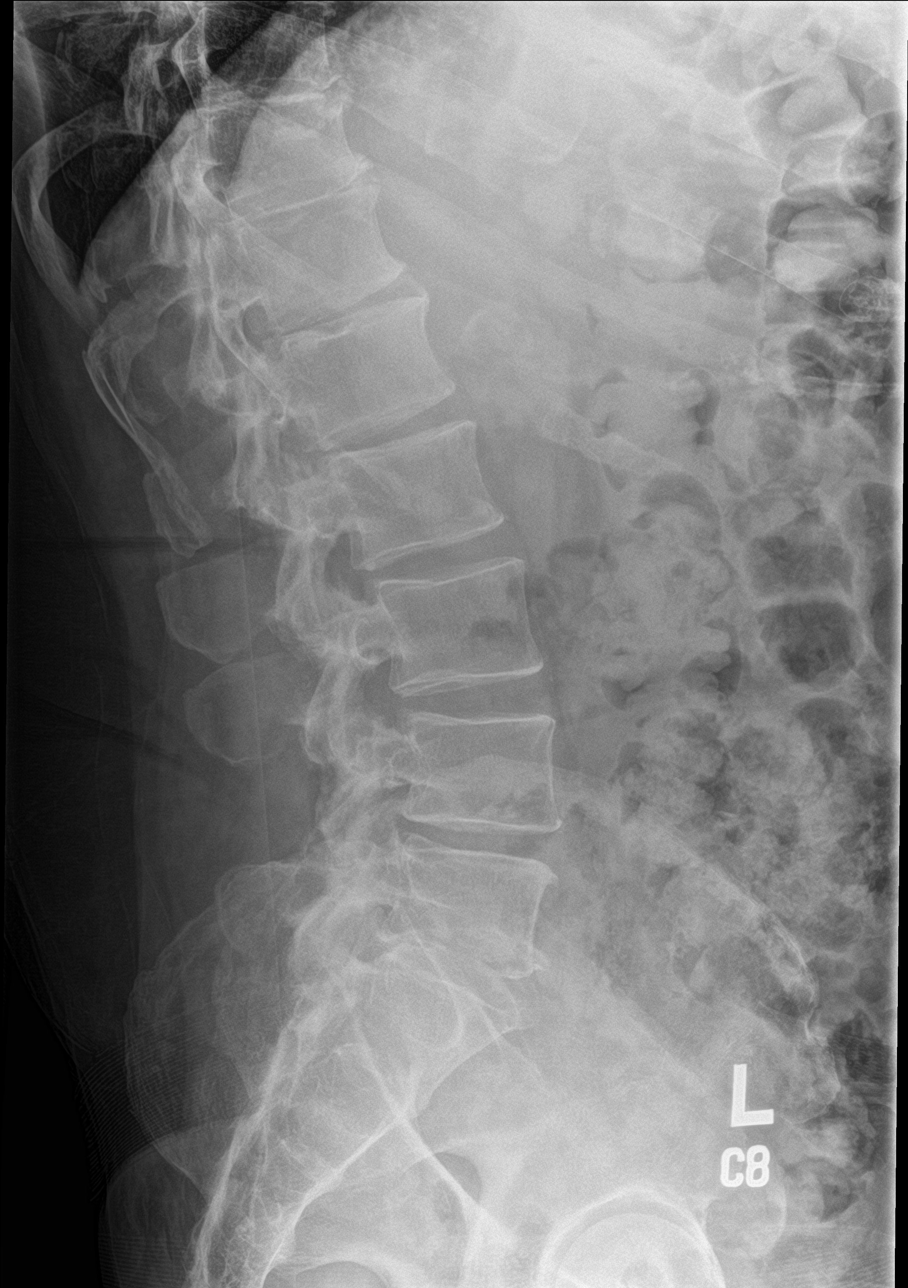

[l-spine obl (3 of 3)]
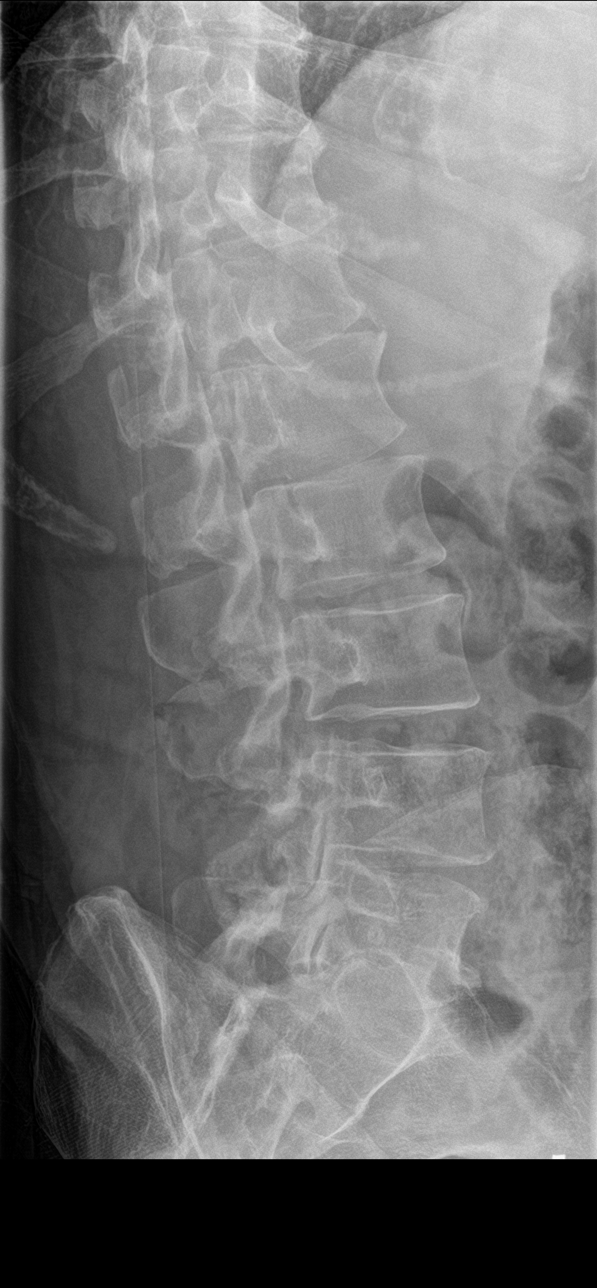

[l-spine spot]
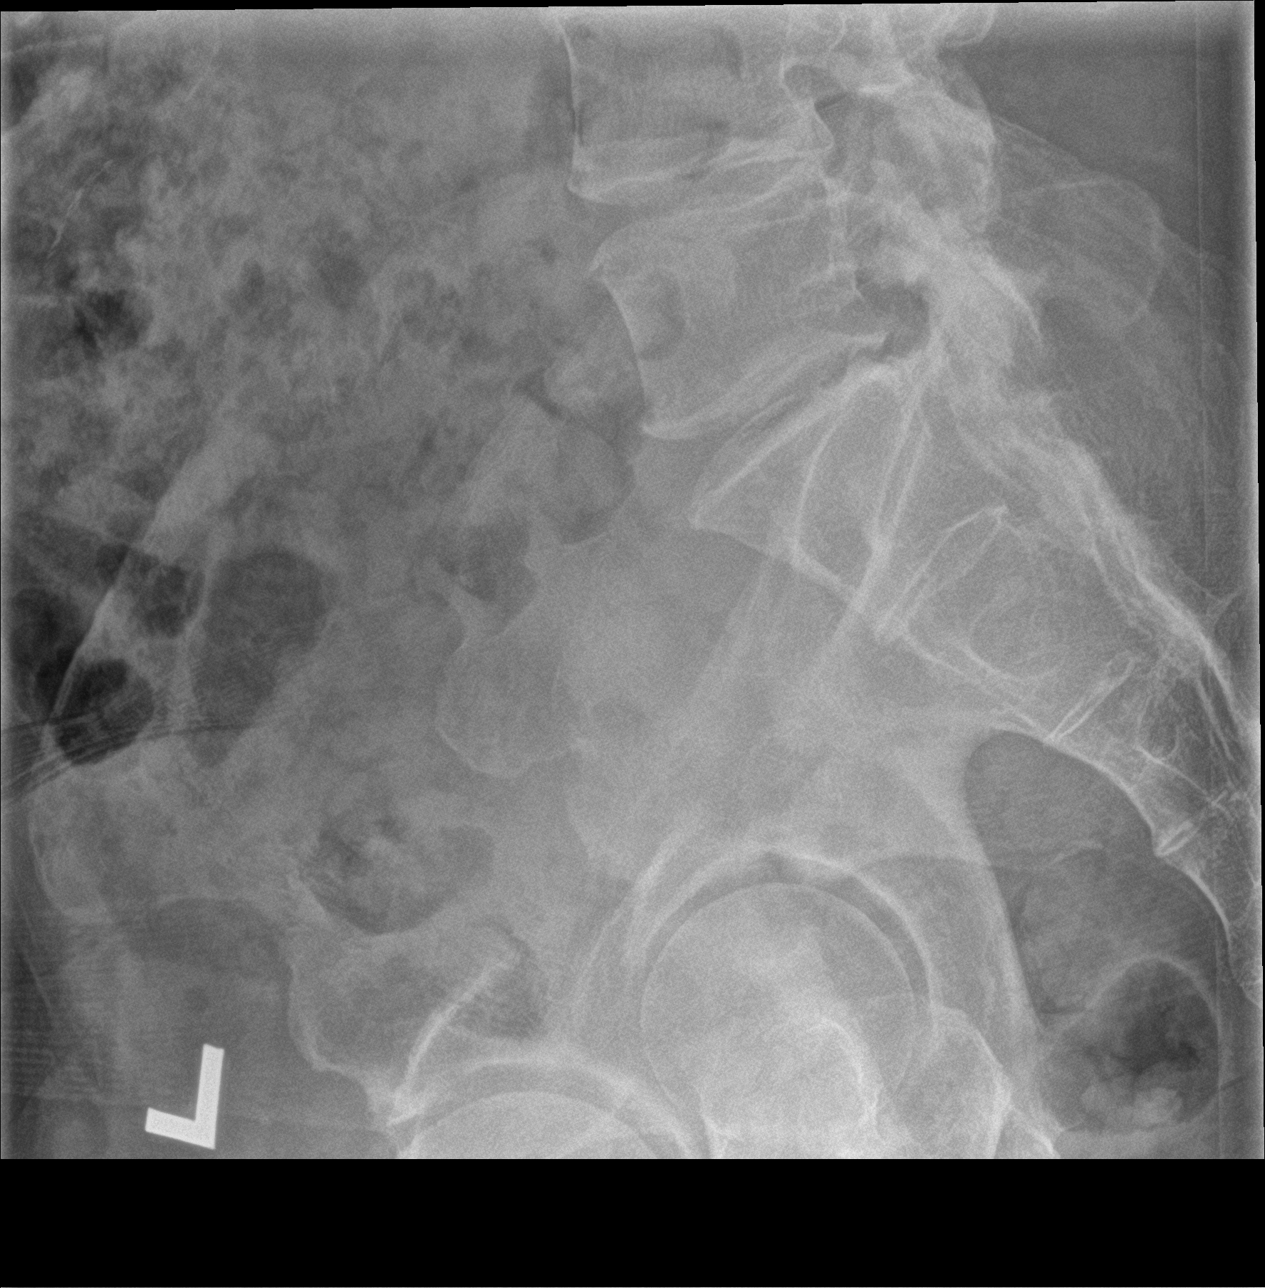

[5 of 5 positions shown; findings below may reference images not displayed]

FINDINGS: There is no acute fracture or dislocation. There is mild scoliosis
of the spine. Mild degenerative joint changes of thoracolumbar
junction and lower lumbar spine with narrowed joint space and
osteophyte formation are noted.
IMPRESSION: No acute abnormality. Mild degenerative joint changes of
thoracolumbar junction and lower lumbar spine.

## 2022-10-22 DIAGNOSIS — I6389 Other cerebral infarction: Secondary | ICD-10-CM

## 2022-10-23 DIAGNOSIS — T401X1A Poisoning by heroin, accidental (unintentional), initial encounter: Secondary | ICD-10-CM

## 2022-10-23 DIAGNOSIS — R509 Fever, unspecified: Secondary | ICD-10-CM

## 2022-10-23 DIAGNOSIS — N179 Acute kidney failure, unspecified: Secondary | ICD-10-CM

## 2022-10-23 DIAGNOSIS — F1910 Other psychoactive substance abuse, uncomplicated: Secondary | ICD-10-CM

## 2022-10-24 DIAGNOSIS — N179 Acute kidney failure, unspecified: Secondary | ICD-10-CM | POA: Diagnosis not present

## 2022-10-24 DIAGNOSIS — R509 Fever, unspecified: Secondary | ICD-10-CM | POA: Diagnosis not present

## 2022-10-24 DIAGNOSIS — T401X1A Poisoning by heroin, accidental (unintentional), initial encounter: Secondary | ICD-10-CM | POA: Diagnosis not present

## 2022-10-24 DIAGNOSIS — F1910 Other psychoactive substance abuse, uncomplicated: Secondary | ICD-10-CM | POA: Diagnosis not present

## 2022-10-27 DIAGNOSIS — R509 Fever, unspecified: Secondary | ICD-10-CM | POA: Diagnosis not present

## 2022-10-27 DIAGNOSIS — T401X1A Poisoning by heroin, accidental (unintentional), initial encounter: Secondary | ICD-10-CM | POA: Diagnosis not present

## 2022-10-27 DIAGNOSIS — F1910 Other psychoactive substance abuse, uncomplicated: Secondary | ICD-10-CM | POA: Diagnosis not present

## 2022-10-27 DIAGNOSIS — N179 Acute kidney failure, unspecified: Secondary | ICD-10-CM | POA: Diagnosis not present

## 2023-01-30 ENCOUNTER — Other Ambulatory Visit (HOSPITAL_COMMUNITY): Payer: 59

## 2023-01-30 ENCOUNTER — Inpatient Hospital Stay: Admit: 2023-01-30 | Discharge: 2023-02-15 | Disposition: E | Payer: 59 | Source: Other Acute Inpatient Hospital

## 2023-01-30 LAB — CBC
HCT: 23.1 % — ABNORMAL LOW (ref 39.0–52.0)
Hemoglobin: 7.2 g/dL — ABNORMAL LOW (ref 13.0–17.0)
MCH: 28.9 pg (ref 26.0–34.0)
MCHC: 31.2 g/dL (ref 30.0–36.0)
MCV: 92.8 fL (ref 80.0–100.0)
Platelets: 205 10*3/uL (ref 150–400)
RBC: 2.49 MIL/uL — ABNORMAL LOW (ref 4.22–5.81)
RDW: 19.4 % — ABNORMAL HIGH (ref 11.5–15.5)
WBC: 12.2 10*3/uL — ABNORMAL HIGH (ref 4.0–10.5)
nRBC: 0 % (ref 0.0–0.2)

## 2023-01-30 LAB — COMPREHENSIVE METABOLIC PANEL
ALT: 8 U/L (ref 0–44)
AST: 12 U/L — ABNORMAL LOW (ref 15–41)
Albumin: <1.5 g/dL — ABNORMAL LOW (ref 3.5–5.0)
Alkaline Phosphatase: 76 U/L (ref 38–126)
Anion gap: 11 (ref 5–15)
BUN: 37 mg/dL — ABNORMAL HIGH (ref 6–20)
CO2: 19 mmol/L — ABNORMAL LOW (ref 22–32)
Calcium: 8.8 mg/dL — ABNORMAL LOW (ref 8.9–10.3)
Chloride: 107 mmol/L (ref 98–111)
Creatinine, Ser: 3.07 mg/dL — ABNORMAL HIGH (ref 0.61–1.24)
GFR, Estimated: 24 mL/min — ABNORMAL LOW (ref >60)
Glucose, Bld: 73 mg/dL (ref 70–99)
Potassium: 3.9 mmol/L (ref 3.5–5.1)
Sodium: 137 mmol/L (ref 135–145)
Total Bilirubin: 0.5 mg/dL (ref 0.3–1.2)
Total Protein: 5.6 g/dL — ABNORMAL LOW (ref 6.5–8.1)

## 2023-01-31 ENCOUNTER — Other Ambulatory Visit (HOSPITAL_COMMUNITY): Payer: 59

## 2023-01-31 DIAGNOSIS — G894 Chronic pain syndrome: Secondary | ICD-10-CM

## 2023-01-31 DIAGNOSIS — G8250 Quadriplegia, unspecified: Secondary | ICD-10-CM

## 2023-01-31 DIAGNOSIS — J15212 Pneumonia due to Methicillin resistant Staphylococcus aureus: Secondary | ICD-10-CM

## 2023-01-31 DIAGNOSIS — J9621 Acute and chronic respiratory failure with hypoxia: Secondary | ICD-10-CM

## 2023-01-31 LAB — MAGNESIUM: Magnesium: 2.3 mg/dL (ref 1.7–2.4)

## 2023-02-01 DIAGNOSIS — J15212 Pneumonia due to Methicillin resistant Staphylococcus aureus: Secondary | ICD-10-CM

## 2023-02-01 DIAGNOSIS — J9621 Acute and chronic respiratory failure with hypoxia: Secondary | ICD-10-CM

## 2023-02-01 DIAGNOSIS — G8250 Quadriplegia, unspecified: Secondary | ICD-10-CM

## 2023-02-01 DIAGNOSIS — G894 Chronic pain syndrome: Secondary | ICD-10-CM

## 2023-02-01 LAB — CBC
HCT: 22.5 % — ABNORMAL LOW (ref 39.0–52.0)
Hemoglobin: 7 g/dL — ABNORMAL LOW (ref 13.0–17.0)
MCH: 29.5 pg (ref 26.0–34.0)
MCHC: 31.1 g/dL (ref 30.0–36.0)
MCV: 94.9 fL (ref 80.0–100.0)
Platelets: 218 10*3/uL (ref 150–400)
RBC: 2.37 MIL/uL — ABNORMAL LOW (ref 4.22–5.81)
RDW: 20.5 % — ABNORMAL HIGH (ref 11.5–15.5)
WBC: 13.3 10*3/uL — ABNORMAL HIGH (ref 4.0–10.5)
nRBC: 0 % (ref 0.0–0.2)

## 2023-02-01 LAB — BASIC METABOLIC PANEL
Anion gap: 13 (ref 5–15)
BUN: 41 mg/dL — ABNORMAL HIGH (ref 6–20)
CO2: 16 mmol/L — ABNORMAL LOW (ref 22–32)
Calcium: 8.6 mg/dL — ABNORMAL LOW (ref 8.9–10.3)
Chloride: 108 mmol/L (ref 98–111)
Creatinine, Ser: 3.27 mg/dL — ABNORMAL HIGH (ref 0.61–1.24)
GFR, Estimated: 22 mL/min — ABNORMAL LOW (ref >60)
Glucose, Bld: 104 mg/dL — ABNORMAL HIGH (ref 70–99)
Potassium: 4.3 mmol/L (ref 3.5–5.1)
Sodium: 137 mmol/L (ref 135–145)

## 2023-02-01 LAB — PREPARE RBC (CROSSMATCH)

## 2023-02-01 LAB — HEMOGLOBIN AND HEMATOCRIT, BLOOD
HCT: 24.8 % — ABNORMAL LOW (ref 39.0–52.0)
Hemoglobin: 7.9 g/dL — ABNORMAL LOW (ref 13.0–17.0)

## 2023-02-02 DIAGNOSIS — J15212 Pneumonia due to Methicillin resistant Staphylococcus aureus: Secondary | ICD-10-CM

## 2023-02-02 DIAGNOSIS — G8250 Quadriplegia, unspecified: Secondary | ICD-10-CM

## 2023-02-02 DIAGNOSIS — G894 Chronic pain syndrome: Secondary | ICD-10-CM

## 2023-02-02 DIAGNOSIS — J9621 Acute and chronic respiratory failure with hypoxia: Secondary | ICD-10-CM

## 2023-02-02 LAB — BASIC METABOLIC PANEL
Anion gap: 11 (ref 5–15)
BUN: 44 mg/dL — ABNORMAL HIGH (ref 6–20)
CO2: 19 mmol/L — ABNORMAL LOW (ref 22–32)
Calcium: 8.2 mg/dL — ABNORMAL LOW (ref 8.9–10.3)
Chloride: 107 mmol/L (ref 98–111)
Creatinine, Ser: 3.08 mg/dL — ABNORMAL HIGH (ref 0.61–1.24)
GFR, Estimated: 23 mL/min — ABNORMAL LOW (ref >60)
Glucose, Bld: 79 mg/dL (ref 70–99)
Potassium: 3.9 mmol/L (ref 3.5–5.1)
Sodium: 137 mmol/L (ref 135–145)

## 2023-02-02 LAB — CBC
HCT: 25.5 % — ABNORMAL LOW (ref 39.0–52.0)
Hemoglobin: 7.9 g/dL — ABNORMAL LOW (ref 13.0–17.0)
MCH: 27.9 pg (ref 26.0–34.0)
MCHC: 31 g/dL (ref 30.0–36.0)
MCV: 90.1 fL (ref 80.0–100.0)
Platelets: 202 10*3/uL (ref 150–400)
RBC: 2.83 MIL/uL — ABNORMAL LOW (ref 4.22–5.81)
RDW: 20.9 % — ABNORMAL HIGH (ref 11.5–15.5)
WBC: 9.5 10*3/uL (ref 4.0–10.5)
nRBC: 0 % (ref 0.0–0.2)

## 2023-02-02 LAB — TYPE AND SCREEN
ABO/RH(D): A POS
Antibody Screen: NEGATIVE
Unit division: 0

## 2023-02-02 LAB — BPAM RBC
Blood Product Expiration Date: 202409032359
ISSUE DATE / TIME: 202408181047
Unit Type and Rh: 6200

## 2023-02-03 ENCOUNTER — Other Ambulatory Visit (HOSPITAL_COMMUNITY): Payer: 59

## 2023-02-03 DIAGNOSIS — G894 Chronic pain syndrome: Secondary | ICD-10-CM

## 2023-02-03 DIAGNOSIS — G8250 Quadriplegia, unspecified: Secondary | ICD-10-CM

## 2023-02-03 DIAGNOSIS — J9621 Acute and chronic respiratory failure with hypoxia: Secondary | ICD-10-CM

## 2023-02-03 DIAGNOSIS — J15212 Pneumonia due to Methicillin resistant Staphylococcus aureus: Secondary | ICD-10-CM

## 2023-02-04 ENCOUNTER — Other Ambulatory Visit (HOSPITAL_COMMUNITY): Payer: 59

## 2023-02-04 LAB — CBC WITH DIFFERENTIAL/PLATELET
Abs Immature Granulocytes: 0.12 10*3/uL — ABNORMAL HIGH (ref 0.00–0.07)
Basophils Absolute: 0 10*3/uL (ref 0.0–0.1)
Basophils Relative: 0 %
Eosinophils Absolute: 0.1 10*3/uL (ref 0.0–0.5)
Eosinophils Relative: 1 %
HCT: 22.6 % — ABNORMAL LOW (ref 39.0–52.0)
Hemoglobin: 7.5 g/dL — ABNORMAL LOW (ref 13.0–17.0)
Immature Granulocytes: 1 %
Lymphocytes Relative: 12 %
Lymphs Abs: 1.1 10*3/uL (ref 0.7–4.0)
MCH: 29.9 pg (ref 26.0–34.0)
MCHC: 33.2 g/dL (ref 30.0–36.0)
MCV: 90 fL (ref 80.0–100.0)
Monocytes Absolute: 0.7 10*3/uL (ref 0.1–1.0)
Monocytes Relative: 8 %
Neutro Abs: 7.1 10*3/uL (ref 1.7–7.7)
Neutrophils Relative %: 78 %
Platelets: 273 10*3/uL (ref 150–400)
RBC: 2.51 MIL/uL — ABNORMAL LOW (ref 4.22–5.81)
RDW: 19.9 % — ABNORMAL HIGH (ref 11.5–15.5)
WBC: 9.1 10*3/uL (ref 4.0–10.5)
nRBC: 0 % (ref 0.0–0.2)

## 2023-02-04 LAB — BASIC METABOLIC PANEL
Anion gap: 11 (ref 5–15)
BUN: 38 mg/dL — ABNORMAL HIGH (ref 6–20)
CO2: 19 mmol/L — ABNORMAL LOW (ref 22–32)
Calcium: 8 mg/dL — ABNORMAL LOW (ref 8.9–10.3)
Chloride: 107 mmol/L (ref 98–111)
Creatinine, Ser: 2.55 mg/dL — ABNORMAL HIGH (ref 0.61–1.24)
GFR, Estimated: 29 mL/min — ABNORMAL LOW (ref >60)
Glucose, Bld: 108 mg/dL — ABNORMAL HIGH (ref 70–99)
Potassium: 3.8 mmol/L (ref 3.5–5.1)
Sodium: 137 mmol/L (ref 135–145)

## 2023-02-04 LAB — PHOSPHORUS: Phosphorus: 3.6 mg/dL (ref 2.5–4.6)

## 2023-02-04 LAB — MAGNESIUM: Magnesium: 2 mg/dL (ref 1.7–2.4)

## 2023-02-06 ENCOUNTER — Other Ambulatory Visit (HOSPITAL_COMMUNITY): Payer: 59

## 2023-02-06 MED ORDER — LIDOCAINE VISCOUS HCL 2 % MT SOLN
3.0000 mL | Freq: Once | OROMUCOSAL | Status: AC
  Administered 2023-02-06: 3 mL via OROMUCOSAL

## 2023-02-06 MED ORDER — IOPAMIDOL (ISOVUE-300) INJECTION 61%
10.0000 mL | Freq: Once | INTRAVENOUS | Status: AC | PRN
  Administered 2023-02-06: 10 mL

## 2023-02-07 LAB — CBC WITH DIFFERENTIAL/PLATELET
Abs Immature Granulocytes: 0.36 10*3/uL — ABNORMAL HIGH (ref 0.00–0.07)
Basophils Absolute: 0 10*3/uL (ref 0.0–0.1)
Basophils Relative: 0 %
Eosinophils Absolute: 0 10*3/uL (ref 0.0–0.5)
Eosinophils Relative: 0 %
HCT: 28.2 % — ABNORMAL LOW (ref 39.0–52.0)
Hemoglobin: 8.1 g/dL — ABNORMAL LOW (ref 13.0–17.0)
Immature Granulocytes: 2 %
Lymphocytes Relative: 5 %
Lymphs Abs: 1 10*3/uL (ref 0.7–4.0)
MCH: 29 pg (ref 26.0–34.0)
MCHC: 28.7 g/dL — ABNORMAL LOW (ref 30.0–36.0)
MCV: 101.1 fL — ABNORMAL HIGH (ref 80.0–100.0)
Monocytes Absolute: 0.9 10*3/uL (ref 0.1–1.0)
Monocytes Relative: 4 %
Neutro Abs: 18.1 10*3/uL — ABNORMAL HIGH (ref 1.7–7.7)
Neutrophils Relative %: 89 %
Platelets: 271 10*3/uL (ref 150–400)
RBC: 2.79 MIL/uL — ABNORMAL LOW (ref 4.22–5.81)
RDW: 18.4 % — ABNORMAL HIGH (ref 11.5–15.5)
WBC: 20.4 10*3/uL — ABNORMAL HIGH (ref 4.0–10.5)
nRBC: 0 % (ref 0.0–0.2)

## 2023-02-07 LAB — EXPECTORATED SPUTUM ASSESSMENT W GRAM STAIN, RFLX TO RESP C

## 2023-02-07 LAB — BASIC METABOLIC PANEL
Anion gap: 8 (ref 5–15)
BUN: 33 mg/dL — ABNORMAL HIGH (ref 6–20)
CO2: 20 mmol/L — ABNORMAL LOW (ref 22–32)
Calcium: 7.8 mg/dL — ABNORMAL LOW (ref 8.9–10.3)
Chloride: 113 mmol/L — ABNORMAL HIGH (ref 98–111)
Creatinine, Ser: 1.79 mg/dL — ABNORMAL HIGH (ref 0.61–1.24)
GFR, Estimated: 45 mL/min — ABNORMAL LOW (ref >60)
Glucose, Bld: 190 mg/dL — ABNORMAL HIGH (ref 70–99)
Potassium: 4.5 mmol/L (ref 3.5–5.1)
Sodium: 141 mmol/L (ref 135–145)

## 2023-02-07 LAB — PHOSPHORUS: Phosphorus: 6.4 mg/dL — ABNORMAL HIGH (ref 2.5–4.6)

## 2023-02-07 LAB — MAGNESIUM: Magnesium: 1.9 mg/dL (ref 1.7–2.4)

## 2023-02-08 ENCOUNTER — Other Ambulatory Visit (HOSPITAL_COMMUNITY): Payer: 59

## 2023-02-08 LAB — TROPONIN I (HIGH SENSITIVITY): Troponin I (High Sensitivity): 43 ng/L — ABNORMAL HIGH (ref <18)

## 2023-02-08 LAB — BASIC METABOLIC PANEL
Anion gap: 9 (ref 5–15)
BUN: 38 mg/dL — ABNORMAL HIGH (ref 6–20)
CO2: 20 mmol/L — ABNORMAL LOW (ref 22–32)
Calcium: 7.8 mg/dL — ABNORMAL LOW (ref 8.9–10.3)
Chloride: 114 mmol/L — ABNORMAL HIGH (ref 98–111)
Creatinine, Ser: 1.91 mg/dL — ABNORMAL HIGH (ref 0.61–1.24)
GFR, Estimated: 42 mL/min — ABNORMAL LOW (ref >60)
Glucose, Bld: 100 mg/dL — ABNORMAL HIGH (ref 70–99)
Potassium: 4.6 mmol/L (ref 3.5–5.1)
Sodium: 143 mmol/L (ref 135–145)

## 2023-02-08 LAB — CBC WITH DIFFERENTIAL/PLATELET
Abs Immature Granulocytes: 0.26 10*3/uL — ABNORMAL HIGH (ref 0.00–0.07)
Basophils Absolute: 0 10*3/uL (ref 0.0–0.1)
Basophils Relative: 0 %
Eosinophils Absolute: 0.1 10*3/uL (ref 0.0–0.5)
Eosinophils Relative: 1 %
HCT: 25.4 % — ABNORMAL LOW (ref 39.0–52.0)
Hemoglobin: 7.4 g/dL — ABNORMAL LOW (ref 13.0–17.0)
Immature Granulocytes: 2 %
Lymphocytes Relative: 8 %
Lymphs Abs: 1.1 10*3/uL (ref 0.7–4.0)
MCH: 28 pg (ref 26.0–34.0)
MCHC: 29.1 g/dL — ABNORMAL LOW (ref 30.0–36.0)
MCV: 96.2 fL (ref 80.0–100.0)
Monocytes Absolute: 0.6 10*3/uL (ref 0.1–1.0)
Monocytes Relative: 4 %
Neutro Abs: 11.8 10*3/uL — ABNORMAL HIGH (ref 1.7–7.7)
Neutrophils Relative %: 85 %
Platelets: 256 10*3/uL (ref 150–400)
RBC: 2.64 MIL/uL — ABNORMAL LOW (ref 4.22–5.81)
RDW: 17.7 % — ABNORMAL HIGH (ref 11.5–15.5)
WBC: 13.8 10*3/uL — ABNORMAL HIGH (ref 4.0–10.5)
nRBC: 0 % (ref 0.0–0.2)

## 2023-02-08 LAB — PROTIME-INR
INR: 1.9 — ABNORMAL HIGH (ref 0.8–1.2)
Prothrombin Time: 22.4 seconds — ABNORMAL HIGH (ref 11.4–15.2)

## 2023-02-08 LAB — TYPE AND SCREEN
ABO/RH(D): A POS
Antibody Screen: NEGATIVE

## 2023-02-08 LAB — MAGNESIUM: Magnesium: 1.8 mg/dL (ref 1.7–2.4)

## 2023-02-08 LAB — APTT: aPTT: 34 seconds (ref 24–36)

## 2023-02-08 LAB — PHOSPHORUS: Phosphorus: 4.1 mg/dL (ref 2.5–4.6)

## 2023-02-08 LAB — D-DIMER, QUANTITATIVE: D-Dimer, Quant: 1.71 ug{FEU}/mL — ABNORMAL HIGH (ref 0.00–0.50)

## 2023-02-09 LAB — CBC WITH DIFFERENTIAL/PLATELET
Abs Immature Granulocytes: 0.4 10*3/uL — ABNORMAL HIGH (ref 0.00–0.07)
Basophils Absolute: 0.1 10*3/uL (ref 0.0–0.1)
Basophils Relative: 0 %
Eosinophils Absolute: 0 10*3/uL (ref 0.0–0.5)
Eosinophils Relative: 0 %
HCT: 30.7 % — ABNORMAL LOW (ref 39.0–52.0)
Hemoglobin: 8.6 g/dL — ABNORMAL LOW (ref 13.0–17.0)
Immature Granulocytes: 3 %
Lymphocytes Relative: 6 %
Lymphs Abs: 0.9 10*3/uL (ref 0.7–4.0)
MCH: 29.4 pg (ref 26.0–34.0)
MCHC: 28 g/dL — ABNORMAL LOW (ref 30.0–36.0)
MCV: 104.8 fL — ABNORMAL HIGH (ref 80.0–100.0)
Monocytes Absolute: 0.5 10*3/uL (ref 0.1–1.0)
Monocytes Relative: 3 %
Neutro Abs: 13.5 10*3/uL — ABNORMAL HIGH (ref 1.7–7.7)
Neutrophils Relative %: 88 %
Platelets: 228 10*3/uL (ref 150–400)
RBC: 2.93 MIL/uL — ABNORMAL LOW (ref 4.22–5.81)
RDW: 17.4 % — ABNORMAL HIGH (ref 11.5–15.5)
WBC: 15.3 10*3/uL — ABNORMAL HIGH (ref 4.0–10.5)
nRBC: 0.3 % — ABNORMAL HIGH (ref 0.0–0.2)

## 2023-02-09 LAB — BASIC METABOLIC PANEL
Anion gap: 7 (ref 5–15)
BUN: 38 mg/dL — ABNORMAL HIGH (ref 6–20)
CO2: 24 mmol/L (ref 22–32)
Calcium: 8.2 mg/dL — ABNORMAL LOW (ref 8.9–10.3)
Chloride: 111 mmol/L (ref 98–111)
Creatinine, Ser: 2.18 mg/dL — ABNORMAL HIGH (ref 0.61–1.24)
GFR, Estimated: 36 mL/min — ABNORMAL LOW (ref >60)
Glucose, Bld: 69 mg/dL — ABNORMAL LOW (ref 70–99)
Potassium: 5.6 mmol/L — ABNORMAL HIGH (ref 3.5–5.1)
Sodium: 142 mmol/L (ref 135–145)

## 2023-02-09 LAB — PHOSPHORUS: Phosphorus: 7.5 mg/dL — ABNORMAL HIGH (ref 2.5–4.6)

## 2023-02-09 LAB — MAGNESIUM: Magnesium: 2.2 mg/dL (ref 1.7–2.4)

## 2023-02-09 LAB — VANCOMYCIN, TROUGH: Vancomycin Tr: 40 ug/mL (ref 15–20)

## 2023-02-10 ENCOUNTER — Encounter: Payer: Self-pay | Admitting: *Deleted

## 2023-02-10 LAB — CULTURE, RESPIRATORY W GRAM STAIN: Culture: NO GROWTH

## 2023-02-10 NOTE — Congregational Nurse Program (Signed)
[id number]/family requested prayers and chaplin visit.  Went tp see patient.  No family n the room,.  Unable top tell if patient is unresponsive to verbal stimuli.  Waited on wife to return x\30 minutes.  Another visiting pastor was there.  Joint prayer and blessing of life given.  Patient was not responsive the entire visit.  There were no movement of the eyes or extremities.

## 2023-02-11 LAB — VANCOMYCIN, TROUGH: Vancomycin Tr: 37 ug/mL (ref 15–20)

## 2023-02-15 DEATH — deceased
